# Patient Record
Sex: Male | Born: 1970 | Race: White | Hispanic: No | Marital: Married | Smoking: Never smoker
Health system: Southern US, Community
[De-identification: ages and names within clinical notes are randomized; demographics above are authoritative.]

## PROBLEM LIST (undated history)

## (undated) DIAGNOSIS — M1990 Unspecified osteoarthritis, unspecified site: Secondary | ICD-10-CM

## (undated) HISTORY — DX: Unspecified osteoarthritis, unspecified site: M19.90

---
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
## 2009-02-11 ENCOUNTER — Ambulatory Visit: Payer: Self-pay | Admitting: Family Medicine

## 2012-12-08 DIAGNOSIS — E669 Obesity, unspecified: Secondary | ICD-10-CM | POA: Insufficient documentation

## 2017-11-26 ENCOUNTER — Ambulatory Visit (INDEPENDENT_AMBULATORY_CARE_PROVIDER_SITE_OTHER): Payer: BLUE CROSS/BLUE SHIELD | Admitting: Urology

## 2017-11-26 ENCOUNTER — Encounter: Payer: Self-pay | Admitting: Urology

## 2017-11-26 VITALS — BP 146/90 | HR 82 | Ht 74.0 in | Wt 280.0 lb

## 2017-11-26 DIAGNOSIS — N5203 Combined arterial insufficiency and corporo-venous occlusive erectile dysfunction: Secondary | ICD-10-CM

## 2017-11-26 DIAGNOSIS — N442 Benign cyst of testis: Secondary | ICD-10-CM

## 2017-11-26 DIAGNOSIS — N451 Epididymitis: Secondary | ICD-10-CM

## 2017-11-26 MED ORDER — DOXYCYCLINE HYCLATE 100 MG PO CAPS: 100.0000 mg | ORAL_CAPSULE | Freq: Two times a day (BID) | ORAL | 0 refills | Status: DC

## 2017-11-26 MED ORDER — SILDENAFIL CITRATE 20 MG PO TABS: 20.0000 mg | ORAL_TABLET | ORAL | 11 refills | Status: DC | PRN

## 2017-11-26 NOTE — Progress Notes (Signed)
11/26/2017 3:07 PM   Christopher Watts Apr 08, 1971 [id number]  Referring provider: Dione Housekeeper, MD [address] [place], [place] [address]  Chief Complaint  Patient presents with  . Groin Pain    New Patient    HPI: 47 year old male who presents today to discuss right testicular pain as well as right epididymal cyst.  He reports that about a month ago, he was experiencing right flank pain which extended across his back and radiated to his groin and right testicle.  His back pain has since resolved but he continues to have intermittent moderate to severe right scrotal pain which is exacerbated by physical activity.  It is improving but not completely resolved.  He reports that when walking a long distance, he has worsened pain in the right testicle.  He denies any urinary symptoms including gross hematuria or dysuria.  No history of urinary tract infections or epididymitis.  He does have a remote history of right scrotal surgery performed by Dr. Jenne Pane at the age of 74.  He enlisted in [employer] and was found to have an incidental what sounds like a right epididymal cyst and underwent excision of this.  He does not believe the lesion was intratesticular.  As part of the evaluation for his continued testicular discomfort, he underwent a scrotal ultrasound performed at [facility] facility indicating a 3 mm cyst present peripherally within the right testicle but no other testicular masses.  He also had a right epididymal mass measuring 8 x 8 x 10 mm described as having low-level internal echoes without any obvious intraluminal vascularity.  This was thought to represent or spermatocele or epididymal cyst complicated by hemorrhage or infection.  He has not been treated with any antibiotics.  He also as an aside today mentions that he is been struggling with erectile dysfunction.  He reports that he has difficulty achieving an erection at times with decreased overall tumescence.   He also occasionally loses erection prematurely.  He has not previously tried any medications.  He has multiple risk factors for cardiac disease including obesity, borderline diabetes, borderline hyperlipidemia.    PMH: Past Medical History:  Diagnosis Date  . Arthritis     Surgical History: Past Surgical History:  Procedure Laterality Date  . TESTICLAR CYST EXCISION  1989    Home Medications:  Allergies as of 11/26/2017   Not on File     Medication List        Accurate as of 11/26/17 11:59 PM. Always use your most recent med list.          doxycycline 100 MG capsule Commonly known as:  VIBRAMYCIN Take 1 capsule (100 mg total) by mouth 2 (two) times daily.   sildenafil 20 MG tablet Commonly known as:  REVATIO Take 1 tablet (20 mg total) by mouth as needed. Take 1-5 tabs as needed prior to intercourse       Allergies: Not on File  Family History: History reviewed. No pertinent family history.  Social History:  reports that he has never smoked. He has never used smokeless tobacco. He reports that he drank alcohol. He reports that he has current or past drug history.  ROS: UROLOGY Frequent Urination?: Yes Hard to postpone urination?: No Burning/pain with urination?: No Get up at night to urinate?: Yes Leakage of urine?: No Urine stream starts and stops?: No Trouble starting stream?: No Do you have to strain to urinate?: No Blood in urine?: No Urinary tract infection?: No Sexually  transmitted disease?: No Injury to kidneys or bladder?: No Painful intercourse?: No Weak stream?: No Erection problems?: Yes Penile pain?: No  Gastrointestinal Nausea?: No Vomiting?: No Indigestion/heartburn?: No Diarrhea?: No Constipation?: No  Constitutional Fever: No Night sweats?: Yes Weight loss?: No Fatigue?: Yes  Skin Skin rash/lesions?: No Itching?: No  Eyes Blurred vision?: No Double vision?: No  Ears/Nose/Throat Sore throat?: No Sinus problems?:  No  Hematologic/Lymphatic Swollen glands?: No Easy bruising?: No  Cardiovascular Leg swelling?: No Chest pain?: No  Respiratory Cough?: Yes Shortness of breath?: No  Endocrine Excessive thirst?: No  Musculoskeletal Back pain?: Yes Joint pain?: Yes  Neurological Headaches?: No Dizziness?: No  Psychologic Depression?: No Anxiety?: No  Physical Exam: BP (!) 146/90   Pulse 82   Ht  (1.88 m)   Wt 280 lb (127 kg)   BMI 35.95 kg/m   Constitutional:  Alert and oriented, No acute distress. HEENT: [place] AT, moist mucus membranes.  Trachea midline, no masses. Cardiovascular: No clubbing, cyanosis, or edema. Respiratory: Normal respiratory effort, no increased work of breathing. GI: Abdomen is soft, nontender, nondistended, no abdominal masses GU: Circumcised phallus with orthotopic meatus.  Bilateral descended testicles.  Left testicle normal.  Right testicle with 1 cm epididymal cyst which is exquisitely tender to palpation.  Right testicle without any obvious lesions/tumors. Lymph: No cervical or inguinal lymphadenopathy. Skin: No rashes, bruises or suspicious lesions. Neurologic: Grossly intact, no focal deficits, moving all 4 extremities. Psychiatric: Normal mood and affect.  Laboratory Data: Creatinine 1.104/2019  Urinalysis UA from 10/18/2017 reviewed, negative other than for trace protein and trace ketones, 1+ bilirubin.  No leukocytes or nitrates or blood.  Pertinent Imaging: Scrotal ultrasound with color flow imaging and Doppler  History: Right testicular pain. Testicular mass N50.9   Technique: Real time imaging, color flow imaging, Doppler evaluation were performed.  Findings: Testicles are normal size and uniform echotexture. Normal arterial inflow and venous outflow confirmed within each testicle utilizing color flow imaging and Doppler. A 3 mm cyst is present peripherally in the right testicle; otherwise, no testicular mass is identified.The  left epididymis is unremarkable. The right epididymis contains an 8 x 8 x 10 mm mass with good through transmission but with low-level internal echoes but no obvious intraluminal vascularity.   Impression:  1. Tiny right testicular cyst. 2. One cm maximum diameter mass within the right epididymis may represent a spermatocele or epididymal cyst previously complicated by hemorrhage or infection. It does not show peripheral hyperemia at this time.   Electronically Signed WU:[id number] Konrad Dolores, MD, [facility] Electronically Signed on:10/26/2017 2:37 PM  Other Result Information  Interface, Rad Results In - 10/26/2017  2:38 PM EDT Scrotal ultrasound with color flow imaging and Doppler  History: Right testicular pain. Testicular mass N50.9   Technique: Real time imaging, color flow imaging, Doppler evaluation were performed.  Findings: Testicles are normal size and uniform echotexture. Normal arterial inflow and venous outflow confirmed within each testicle utilizing color flow imaging and Doppler. A 3 mm cyst is present peripherally in the right testicle; otherwise, no testicular mass is identified.  The left epididymis is unremarkable. The right epididymis contains an 8 x 8 x 10 mm mass with good through transmission but with low-level internal echoes but no obvious intraluminal vascularity.   Impression:  1. Tiny right testicular cyst. 2. One cm maximum diameter mass within the right epididymis may represent a spermatocele or epididymal cyst previously complicated by hemorrhage or infection. It does not show peripheral hyperemia  at this time.   Electronically Signed by:  Isidore Moos, MD, [facility] Electronically Signed on:  10/26/2017 2:37 PM   Imaging report reviewed, unable to visualize actual images  Assessment & Plan:    1. Combined arterial insufficiency and corporo-venous occlusive erectile dysfunction We discussed the pathophysiology of erectile dysfunction  today along with possible congestive any factors. Discussed possible treatment options including PDE 5 inhibitors, vacuum erectile device, intracavernosal injection, MUSE, and placement of the inflatable or malleable penal prosthesis for refractory cases.  In terms of PDE 5 inhibitors, we discussed contraindications for this medication as well as common side effects. Patient was counseled on optimal use. All of his questions were answered in detail.  Given his fairly young age for erectile dysfunction as well as risk factors including borderline hyperlipidemia, borderline diabetes and obesity, I am very concerned that he has underlying cardiovascular disease.  I offered to refer him to cardiology which he declined.  He will discuss this further with his primary care physician.   2. Right epididymitis Right epididymal cyst with suspected concomitant epididymitis due to exquisite tenderness We will go ahead and treat with doxycycline, NSAIDs and supportive care If fails to resolve, advised to return sooner  3. Testicular cyst Based on imaging report, small 3 mm intratesticular cyst Given that I am unable to review the images today, will be helpful to have these images and I have advised him to obtain the disc and bring it to our office In addition, I would like to repeat a scrotal ultrasound about 6 months to ensure stability He is agreeable with this plan - US SCROTUM; Future   Return in about 6 months (around 05/29/2018) for testicular ultrasound.  Vanna Scotland, MD  [facility] [address] [place], [place] [address] [phone]

## 2017-11-28 ENCOUNTER — Encounter: Payer: Self-pay | Admitting: Urology

## 2017-11-29 ENCOUNTER — Telehealth: Payer: Self-pay | Admitting: Urology

## 2017-11-29 ENCOUNTER — Other Ambulatory Visit: Payer: Self-pay

## 2017-11-29 MED ORDER — SILDENAFIL CITRATE 20 MG PO TABS: 20.0000 mg | ORAL_TABLET | ORAL | 11 refills | Status: DC | PRN

## 2017-11-29 NOTE — Telephone Encounter (Signed)
Rx sent to [facility], canceled at [facility], pt informed

## 2017-11-29 NOTE — Telephone Encounter (Signed)
Patient called and stated that the script was sent to [facility] for sildenafil and it should have been sent to [facility]. Can we change that please?  Marcelino Duster

## 2017-12-30 ENCOUNTER — Encounter: Payer: Self-pay | Admitting: Urology

## 2018-06-03 ENCOUNTER — Ambulatory Visit: Payer: BLUE CROSS/BLUE SHIELD | Admitting: Urology

## 2018-07-04 ENCOUNTER — Ambulatory Visit: Payer: BLUE CROSS/BLUE SHIELD

## 2018-07-04 ENCOUNTER — Ambulatory Visit
Admission: RE | Admit: 2018-07-04 | Discharge: 2018-07-04 | Disposition: A | Discharge status: Home / Self Care | Payer: BLUE CROSS/BLUE SHIELD | Source: Ambulatory Visit | Attending: Urology | Admitting: Urology

## 2018-07-04 DIAGNOSIS — N442 Benign cyst of testis: Secondary | ICD-10-CM | POA: Insufficient documentation

## 2018-07-04 DIAGNOSIS — I861 Scrotal varices: Secondary | ICD-10-CM | POA: Diagnosis not present

## 2018-07-04 DIAGNOSIS — N433 Hydrocele, unspecified: Secondary | ICD-10-CM | POA: Insufficient documentation

## 2018-08-10 ENCOUNTER — Encounter: Payer: Self-pay | Admitting: Urology

## 2018-08-10 ENCOUNTER — Ambulatory Visit (INDEPENDENT_AMBULATORY_CARE_PROVIDER_SITE_OTHER): Payer: BLUE CROSS/BLUE SHIELD | Admitting: Urology

## 2018-08-10 VITALS — BP 174/99 | HR 83 | Ht 74.0 in | Wt 288.6 lb

## 2018-08-10 DIAGNOSIS — N451 Epididymitis: Secondary | ICD-10-CM | POA: Diagnosis not present

## 2018-08-10 DIAGNOSIS — N442 Benign cyst of testis: Secondary | ICD-10-CM | POA: Diagnosis not present

## 2018-08-10 DIAGNOSIS — N5203 Combined arterial insufficiency and corporo-venous occlusive erectile dysfunction: Secondary | ICD-10-CM | POA: Diagnosis not present

## 2018-08-10 NOTE — Progress Notes (Signed)
08/10/2018 4:18 PM   Christopher Watts 1971/05/05 [id number]  Referring provider: Arne [place], MD [address] [place], [place] [address]  Chief Complaint  Patient presents with  . Follow-up    HPI: 48 year old male who returns today for six-month follow-up with scrotal ultrasound for history of testicular pain/testicular cyst as well as erectile dysfunction.  Scrotal pain/ testicular cyst/ epididymal mass He does have a remote history of right scrotal surgery performed by Dr. Jenne Pane at the age of 64.  He enlisted in [employer] and was found to have an incidental what sounds like a right epididymal cyst and underwent excision of this.  He does not believe the lesion was intratesticular.   As part of the evaluation for his continued testicular discomfort, he underwent a scrotal ultrasound performed at [facility] facility on 10/26/2017 indicating a 3 mm cyst present peripherally within the right testicle but no other testicular masses.  He also had a right epididymal mass measuring 8 x 8 x 10 mm described as having low-level internal echoes without any obvious intraluminal vascularity.  This was thought to represent or spermatocele or epididymal cyst complicated by hemorrhage or infection.  Follow-up scrotal ultrasound performed 08/04/2017 shows similar unchanged pathology.  Overall, he is doing significantly better.  He was treated for presumed epididymitis after last visit and his acute severe scrotal pain resolved.  He does have a chronic mild dull ache in his right scrotum which he has had for numerous years.  This is unchanged.  The seems to be exacerbated with sexual activity.  Alleviated with rest.  No voiding symptoms.  ED Some mild difficulty maintaining and achieving erections.  Last visit, given prescription for sildenafil which she is using a variable amount.  When he takes higher doses, he has headaches and flushing.  A low-dose, it is better tolerated with  fairly decent effect.  Notably, he has had a recent complete evaluation with lipids as well as EKG by his primary care with no concern for heart disease.  He is prediabetic.  PMH: Past Medical History:  Diagnosis Date  . Arthritis     Surgical History: Past Surgical History:  Procedure Laterality Date  . TESTICLAR CYST EXCISION  1989    Home Medications:  Allergies as of 08/10/2018      Reactions   Penicillins Hives, Rash   In childhood; has tolerated Augmentin as an adult      Medication List       Accurate as of August 10, 2018 11:59 PM. Always use your most recent med list.        sildenafil 20 MG tablet Commonly known as:  REVATIO Take 1 tablet (20 mg total) by mouth as needed. Take 1-5 tabs as needed prior to intercourse       Allergies:  Allergies  Allergen Reactions  . Penicillins Hives and Rash    In childhood; has tolerated Augmentin as an adult     Family History: History reviewed. No pertinent family history.  Social History:  reports that he has never smoked. He has never used smokeless tobacco. He reports previous alcohol use. He reports previous drug use.  ROS: UROLOGY Frequent Urination?: No Hard to postpone urination?: No Burning/pain with urination?: No Get up at night to urinate?: Yes Leakage of urine?: No Urine stream starts and stops?: No Trouble starting stream?: No Do you have to strain to urinate?: No Blood in urine?: No Urinary tract infection?: No Sexually transmitted disease?: No  Injury to kidneys or bladder?: No Painful intercourse?: No Weak stream?: No Erection problems?: No Penile pain?: No  Gastrointestinal Nausea?: No Vomiting?: No Indigestion/heartburn?: No Diarrhea?: No Constipation?: No  Constitutional Fever: No Night sweats?: No Weight loss?: No Fatigue?: No  Skin Skin rash/lesions?: No Itching?: No  Eyes Blurred vision?: No Double vision?: No  Ears/Nose/Throat Sore throat?: No Sinus  problems?: No  Hematologic/Lymphatic Swollen glands?: No Easy bruising?: No  Cardiovascular Leg swelling?: No Chest pain?: No  Respiratory Cough?: No Shortness of breath?: No  Endocrine Excessive thirst?: No  Musculoskeletal Back pain?: No Joint pain?: No  Neurological Headaches?: No Dizziness?: No  Psychologic Depression?: No Anxiety?: No  Physical Exam: BP (!) 174/99 (BP Location: Left Arm, Patient Position: Sitting, Cuff Size: Large)   Pulse 83   Ht 6\' 2"  (1.88 m)   Wt 288 lb 9.6 oz (130.9 kg)   BMI 37.05 kg/m   Constitutional:  Alert and oriented, No acute distress. HEENT: [place] AT, moist mucus membranes.  Trachea midline, no masses. Cardiovascular: No clubbing, cyanosis, or edema. Respiratory: Normal respiratory effort, no increased work of breathing. GI: Abdomen is soft, nontender, nondistended, obese GU: In size phallus with bilateral descended testicles.  Left testicle is normal, no masses.  Right epididymis is enlarged, 1 cm but nontender, presumably secondary to spermatocele.  Right testicle is grossly normal, nontender no masses palpable. Skin: No rashes, bruises or suspicious lesions. Neurologic: Grossly intact, no focal deficits, moving all 4 extremities. Psychiatric: Normal mood and affect.  Pertinent Imaging: CLINICAL DATA:  Right testicular cyst, intermittent pain x4 months.  EXAM: ULTRASOUND OF SCROTUM  TECHNIQUE: Complete ultrasound examination of the testicles, epididymis, and other scrotal structures was performed.  COMPARISON:  None.  FINDINGS: Right testicle  Measurements: 4.8 x 2.3 x 3.2 cm. A well-circumscribed hypoechoic thinly septated appearing cyst possibly a tunic albuginea or tiny intratesticular cyst is noted measuring approximately 3 mm in diameter along the lateral interpolar aspect. No demonstrable vascularity is seen within.  Left testicle  Measurements: 4.8 x 2.7 x 2.8 cm. No mass or  microlithiasis visualized.  Right epididymis: Normal in size measuring approximately 1.6 x 1.2 x 2.5 cm. A hypoechoic 1.2 x 0.8 x 0.8 cm mass without vascularity is also noted.  Left epididymis:  Normal in size measuring 1.2 x 1.1 x 1.2 cm.  Hydrocele:  Small left hydrocele  Varicocele:  Small left-sided varicocele  IMPRESSION: Tiny 3 mm, too small to further characterize complex cyst associated with the right testicle is identified potentially a tunica albuginea cyst or tiny intratesticular cyst.  Well-circumscribed hypoechoic mass of the right epididymis measuring 1.2 x 0.8 x 0.8 cm without internal vascularity. Extratesticular lesions without demonstrable vascularity more likely to represent a benign finding possibly a complex epididymal cyst or adenomatoid lesion of the epididymis.  Small left-sided hydrocele and varicoceles.   Electronically Signed   By: Tollie Ethavid  Kwon M.D.   On: 07/04/2018 17:14 . Scrotal ultrasound imaging was personally reviewed.  Agree with radiologic interpretation.  Assessment & Plan:    1. Right testicular/scrotal pain, right epididymal cyst/spermatocele We discussed today that given the chronicity of his pain, this may be related to previous scrotal surgery Recommend supportive care, NSAIDs as needed, and referral to PT if deemed necessary Pain is dull and mild this he is able to tolerate it without difficulty We discussed the role of excision of right epididymal cyst, do not suspect that removing this this will improve his pain, patient is not interested  in surgery - US PELVIC DOPPLER (TORSION R/O OR MASS ARTERIAL FLOW); Future - US SCROTUM; Future  2. Testicular cyst Stable 3 mm right intratesticular cyst Given no change since 10/2017, malignancy is very low on differential Plan to repeat scrotal stone 18 months to ensure stability Patient is agreeable this plan He will perform testicular self exams as a precaution - US PELVIC  DOPPLER (TORSION R/O OR MASS ARTERIAL FLOW); Future - US SCROTUM; Future  3. Combined arterial insufficiency and corporo-venous occlusive erectile dysfunction Continue Viagra as needed  Return in about 18 months (around 02/08/2020) for scrotal ultrasound.  Vanna ScotlandAshley Laquesha Holcomb, MD  [facility] [address], [address] [place], [place] [id number] [phone]

## 2018-08-26 ENCOUNTER — Ambulatory Visit: Payer: BLUE CROSS/BLUE SHIELD | Admitting: Urology

## 2019-04-14 ENCOUNTER — Ambulatory Visit: Payer: Self-pay

## 2019-04-14 DIAGNOSIS — Z23 Encounter for immunization: Secondary | ICD-10-CM

## 2019-07-04 ENCOUNTER — Other Ambulatory Visit: Payer: Self-pay

## 2020-01-25 ENCOUNTER — Other Ambulatory Visit: Payer: Self-pay | Admitting: Urology

## 2020-01-25 DIAGNOSIS — N451 Epididymitis: Secondary | ICD-10-CM

## 2020-01-25 DIAGNOSIS — N442 Benign cyst of testis: Secondary | ICD-10-CM

## 2020-02-01 ENCOUNTER — Other Ambulatory Visit: Payer: Self-pay

## 2020-02-01 ENCOUNTER — Ambulatory Visit
Admission: RE | Admit: 2020-02-01 | Discharge: 2020-02-01 | Disposition: A | Discharge status: Home / Self Care | Payer: 59 | Source: Ambulatory Visit | Attending: Urology | Admitting: Urology

## 2020-02-01 ENCOUNTER — Ambulatory Visit: Payer: Managed Care, Other (non HMO)

## 2020-02-01 DIAGNOSIS — N451 Epididymitis: Secondary | ICD-10-CM | POA: Insufficient documentation

## 2020-02-01 DIAGNOSIS — N442 Benign cyst of testis: Secondary | ICD-10-CM | POA: Diagnosis present

## 2020-02-05 NOTE — Progress Notes (Signed)
02/06/2020 9:19 AM   Christopher Watts Dec 13, 1970 [id number]  Referring provider: [facility] [address] [place],  [place] [address] Chief Complaint  Patient presents with  . Follow-up    HPI: Christopher Watts is a 49 y.o. male who returns today for six-month follow-up with scrotal ultrasound for history of testicular pain/testicular cyst as well as erectile dysfunction.  Scrotal pain/ testicular cyst/ epididymal mass He does have a remote history of right scrotal surgery performed by Dr. Jenne Pane at the age of 52. He enlisted in [employer] and was found to have an incidental what sounds like a right epididymal cyst and underwent excision of this. He does not believe the lesion was intratesticular.   As part of the evaluation for his continued testicular discomfort, he underwent a scrotal ultrasound performed at [facility] facility on 10/26/2017 indicating a 3 mm cyst present peripherally within the right testicle but no other testicular masses. He also had a right epididymal mass measuring 8 x 8 x 10 mm described as having low-level internal echoes without any obvious intraluminal vascularity. This was thought to represent or spermatocele or epididymal cyst complicated by hemorrhage or infection.  Follow up scrotal US with doppler on 02/01/2020 showed no acute abnormality.  No evidence for testicular torsion. Relatively stable hypoechoic 1.3 cm mass involving the right epididymis. Again, this may represent a complex epididymal head cyst or adenomatoid lesion. Given its relative stability since prior study, it was almost certainly a benign finding.  He reports having lower abdominal pain with ejaculation. He recently had intercourse and felt dizzy and could not ejaculate. He had a sharp migraine headache, his knees went weak and he felt like he was going to pass out. He felt this episode was related to a low blood sugar and/or dehydration.  This only happened once and  never again.  ED Previously was using PDE 5 inhibitors.  No longer requires this after lifestyle change in behavior change.  Normal spontaneous erections.  Notably, he has had a recent complete evaluation with lipids as well as EKG by his primary care with no concern for heart disease.  He is prediabetic.  PMH: Past Medical History:  Diagnosis Date  . Arthritis     Surgical History: Past Surgical History:  Procedure Laterality Date  . TESTICLAR CYST EXCISION  1989    Home Medications:  Allergies as of 02/06/2020      Reactions   Penicillins Hives, Rash   In childhood; has tolerated Augmentin as an adult      Medication List       Accurate as of February 06, 2020  9:19 AM. If you have any questions, ask your nurse or doctor.        STOP taking these medications   sildenafil 20 MG tablet Commonly known as: Revatio Stopped by: Vanna Scotland, MD       Allergies:  Allergies  Allergen Reactions  . Penicillins Hives and Rash    In childhood; has tolerated Augmentin as an adult     Family History: No family history on file.  Social History:  reports that he has never smoked. He has never used smokeless tobacco. He reports previous alcohol use. He reports previous drug use.   Physical Exam: BP (!) 176/101   Pulse 98   Ht 6\' 2"  (1.88 m)   Wt (!) 285 lb (129.3 kg)   BMI 36.59 kg/m   Constitutional:  Alert and oriented, No acute  distress. HEENT: [place] AT, moist mucus membranes.  Trachea midline, no masses. Cardiovascular: No clubbing, cyanosis, or edema. Respiratory: Normal respiratory effort, no increased work of breathing. GI: Abdomen is soft, nontender, nondistended, no abdominal masses GU: Normal circumcised phallus.  Bilateral testicles descended, no masses or lesions. Skin: No rashes, bruises or suspicious lesions. Neurologic: Grossly intact, no focal deficits, moving all 4 extremities. Psychiatric: Normal mood and affect.   Pertinent Imaging: CLINICAL  DATA:  Test saccular cyst  EXAM: SCROTAL ULTRASOUND  DOPPLER ULTRASOUND OF THE TESTICLES  TECHNIQUE: Complete ultrasound examination of the testicles, epididymis, and other scrotal structures was performed. Color and spectral Doppler ultrasound were also utilized to evaluate blood flow to the testicles.  COMPARISON:  July 04, 2018.  FINDINGS: Right testicle  Measurements: 4.6 x 2.7 x 3.6 cm. No mass or microlithiasis visualized.  Left testicle  Measurements: 4.4 x 2.6 x 2.9 cm. No mass or microlithiasis visualized.  Right epididymis: Again noted is a complex 1.3 x 1.2 x 1.3 cm mass involving the right epididymis (previously measuring 1.2 x 0.8 x 0.8 cm this mass demonstrates no significant internal color Doppler flow.  Left epididymis:  Normal in size and appearance.  Hydrocele:  None visualized.  Varicocele:  There is a left-sided varicocele.  Pulsed Doppler interrogation of both testes demonstrates normal low resistance arterial and venous waveforms bilaterally.  IMPRESSION: 1. No acute abnormality.  No evidence for testicular torsion. 2. Relatively stable hypoechoic 1.3 cm mass involving the right epididymis. Again, this may represent a complex epididymal head cyst or adenomatoid lesion. Given its relative stability since prior study, it is almost certainly a benign finding.   Electronically Signed   By: Katherine Mantle M.D.   On: 02/01/2020 21:37   I have personally reviewed the images and agree with radiologist interpretation.    Assessment & Plan:    1. Right epididymal cyst/spermatocele Stable and unchanged.  Benign.  No follow up surveillance needed.  2. Testicular cyst Not seen on most recent ultrasound, small and minimally worrisome 2019 At this point, I see no further indication for continued surveillance Patient is agreeable this plan  3. Combined arterial insufficiency and corporo-venous occlusive erectile  dysfunction  No longer requiring Viagra, resume as needed Congratulated for lifestyle changes  F/u prn  [facility] [address] [place], [place] [address] [phone]  I, Theador Hawthorne, am acting as a scribe for Dr. Vanna Scotland.  I have reviewed the above documentation for accuracy and completeness, and I agree with the above.   Vanna Scotland, MD

## 2020-02-06 ENCOUNTER — Ambulatory Visit (INDEPENDENT_AMBULATORY_CARE_PROVIDER_SITE_OTHER): Payer: 59 | Admitting: Urology

## 2020-02-06 ENCOUNTER — Encounter: Payer: Self-pay | Admitting: Urology

## 2020-02-06 ENCOUNTER — Other Ambulatory Visit: Payer: Self-pay

## 2020-02-06 VITALS — BP 176/101 | HR 98 | Ht 74.0 in | Wt 285.0 lb

## 2020-02-06 DIAGNOSIS — N451 Epididymitis: Secondary | ICD-10-CM

## 2020-02-06 DIAGNOSIS — N5203 Combined arterial insufficiency and corporo-venous occlusive erectile dysfunction: Secondary | ICD-10-CM | POA: Diagnosis not present

## 2020-02-06 DIAGNOSIS — N442 Benign cyst of testis: Secondary | ICD-10-CM | POA: Diagnosis not present

## 2020-02-07 ENCOUNTER — Other Ambulatory Visit: Payer: Self-pay

## 2020-02-07 ENCOUNTER — Encounter: Payer: Self-pay | Admitting: Emergency Medicine

## 2020-02-07 ENCOUNTER — Ambulatory Visit: Payer: 59 | Admitting: Emergency Medicine

## 2020-02-07 VITALS — BP 157/91 | HR 89 | Temp 97.6°F | Resp 14 | Ht 74.0 in | Wt 291.0 lb

## 2020-02-07 DIAGNOSIS — M25531 Pain in right wrist: Secondary | ICD-10-CM

## 2020-02-07 MED ORDER — MELOXICAM 15 MG PO TABS
15.0000 mg | ORAL_TABLET | Freq: Every day | ORAL | 1 refills | Status: DC
Start: 2020-02-07 — End: 2021-03-12

## 2020-02-07 NOTE — Progress Notes (Signed)
   I have reviewed the triage vital signs and the nursing notes.   HISTORY  Chief Complaint Hand Pain (Right)  HPI Christopher Watts is a 49 y.o. male is here with complaint of right wrist pain off and on for 1 year.  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  He has occasionally taken Tylenol or over-the-counter anti-inflammatories only when it flares up.  He denies any previous symptoms of carpal tunnel.  He states that occasionally he does have some weakness to his right hand when he is picking up something.  Today states his pain is a 2 out of 10.      Past Medical History:  Diagnosis Date  . Arthritis     Patient Active Problem List   Diagnosis Date Noted  . Obesity, unspecified 12/08/2012    Past Surgical History:  Procedure Laterality Date  . TESTICLAR CYST EXCISION  1989    Prior to Admission medications   Not on File    Allergies Penicillins  Family History  Adopted: Yes    Social History Social History   Tobacco Use  . Smoking status: Never Smoker  . Smokeless tobacco: Never Used  Substance Use Topics  . Alcohol use: Not Currently  . Drug use: Not Currently    Review of Systems Constitutional: No fever/chills Musculoskeletal: Positive for right wrist pain. Skin: Negative for rash. Neurological: Negative for  focal weakness or numbness.  ____________________________________________   PHYSICAL EXAM: Constitutional: Alert and oriented. Well appearing and in no acute distress. Eyes: Conjunctivae are normal.  Head: Atraumatic. Nose: No congestion/rhinnorhea. Neck: No stridor.  Musculoskeletal: Right wrist with no deformity.  Patient is able to flex and extend without restriction.  Skin is intact.  No soft tissue edema noted.  Patient is able move all digits without any difficulty and motor sensory function is intact.  Capillary refills less than 3 seconds.  There is a  questionably positive Tinnel's sign. Neurologic:  Normal speech and language. No gross focal neurologic deficits are appreciated.  Skin:  Skin is warm, dry and intact. No rash noted. Psychiatric: Mood and affect are normal. Speech and behavior are normal.   ____________________________________________   FINAL CLINICAL IMPRESSION(S) / ED DIAGNOSES  Chronic right wrist pain, possible early carpal tunnel syndrome.  Patient was placed in a cock-up splint and a prescription for meloxicam was sent to his pharmacy.  He is to take 1 daily with food.  If not improving he is aware that possibly the next step would be either steroids or to send him for a nerve conduction study.  He will let us know how he is doing.    ED Discharge Orders    None       Note:  This document was prepared using Dragon voice recognition software and may include unintentional dictation errors.

## 2020-02-07 NOTE — Progress Notes (Signed)
States BP was elevated yesterday at [facility].  Right Wrist pain started about a year ago. Has been taking advil & tylenol.   Intermittent pain  Flared by up today.  Rates a 2 on scale of 0 - 10.  PCP is [facility] in [place]

## 2020-04-11 ENCOUNTER — Other Ambulatory Visit: Payer: Self-pay

## 2020-04-11 ENCOUNTER — Encounter: Payer: Self-pay | Admitting: Physician Assistant

## 2020-04-11 ENCOUNTER — Ambulatory Visit: Payer: Self-pay | Admitting: Physician Assistant

## 2020-04-11 VITALS — BP 151/93 | HR 85 | Temp 99.3°F | Resp 16 | Ht 74.0 in | Wt 289.0 lb

## 2020-04-11 DIAGNOSIS — M25531 Pain in right wrist: Secondary | ICD-10-CM

## 2020-04-11 NOTE — Addendum Note (Signed)
Addended by: Gardner Candle on: 04/11/2020 09:29 AM   Modules accepted: Orders

## 2020-04-11 NOTE — Progress Notes (Signed)
Pt presents today to follow up on right hand pain, possible carpal tunnel per Bridget Hartshorn, Cordelia Poche  Gretel Acre

## 2020-04-11 NOTE — Progress Notes (Signed)
   Subjective: Right hand pain    Patient ID: Christopher Watts, male    DOB: [birth year], 49 y.o.   MRN: [id number]  HPI Patient presents with 3 to 4 months of intermitting right hand pain. Patient last seen in July 2021 placed in a wrist splint and given anti-inflammatory medications. Patient states the splint is bulky for the type of work he needs to perform so he does not wear it at work. 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 Does not sleep in a splint. Is no longer taking meloxicam and says he will have 1 month supply. Patient is right-hand dominant. Patient only mild discomfort with the left nondominant hand.   Review of Systems Negative septal complaint.    Objective:   Physical Exam No obvious deformity to the right wrist/hand. No edema or erythema. Patient has full neck range of motion. Patient has a mild Tinel signs. Negative Phalen's test.       Assessment & Plan: Right wrist pain.  Patient complaint physical exam consistent with repetitive pain injury. Patient will be consulted to OT/PT for further evaluation. OT/PT discussed this is a nerve problem open consult to neurology.

## 2020-06-26 ENCOUNTER — Other Ambulatory Visit: Payer: Self-pay

## 2020-06-26 DIAGNOSIS — Z1152 Encounter for screening for COVID-19: Secondary | ICD-10-CM

## 2020-06-28 LAB — NOVEL CORONAVIRUS, NAA: SARS-CoV-2, NAA: NOT DETECTED

## 2020-06-28 LAB — SPECIMEN STATUS REPORT

## 2020-06-28 LAB — SARS-COV-2, NAA 2 DAY TAT

## 2020-08-02 NOTE — Progress Notes (Signed)
Pt presents today to receive a rapid covid test. Resulted:

## 2020-08-05 ENCOUNTER — Other Ambulatory Visit: Payer: Self-pay

## 2020-08-05 NOTE — Progress Notes (Signed)
[facility] Rapid covid test administered.  Asymptomatic  Exposure - Daughter who lives in home tested positive 07/26/20  Non-vaccinated  Rapid Covid Test Results - Negative  AMD

## 2020-08-22 IMAGING — US US SCROTUM
1 series · 13 of 25 positions shown · non-contrast
Comparison: None.

CLINICAL DATA: Right testicular cyst, intermittent pain x4 months.

EXAM:
ULTRASOUND OF SCROTUM
TECHNIQUE: Complete ultrasound examination of the testicles, epididymis, and
other scrotal structures was performed.

[Series 1: us scrotum · 0.07mm/px · 13 of 58 slices shown]
[im 1/58]
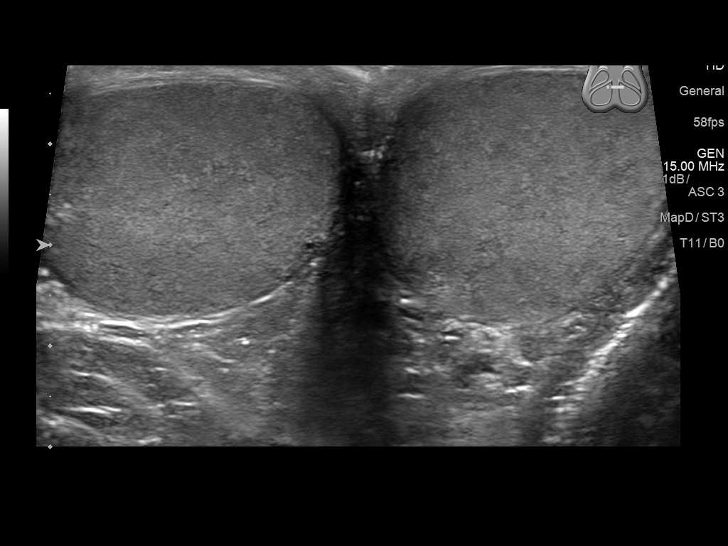
[im 5/58]
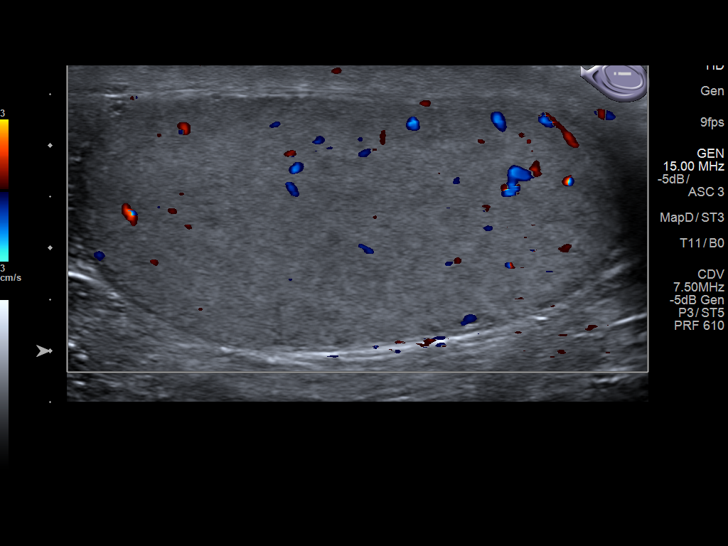
[im 10/58]
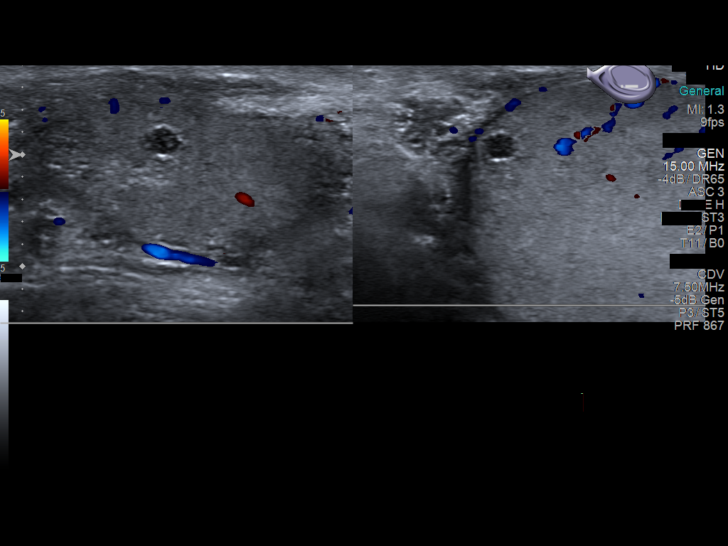
[im 15/58]
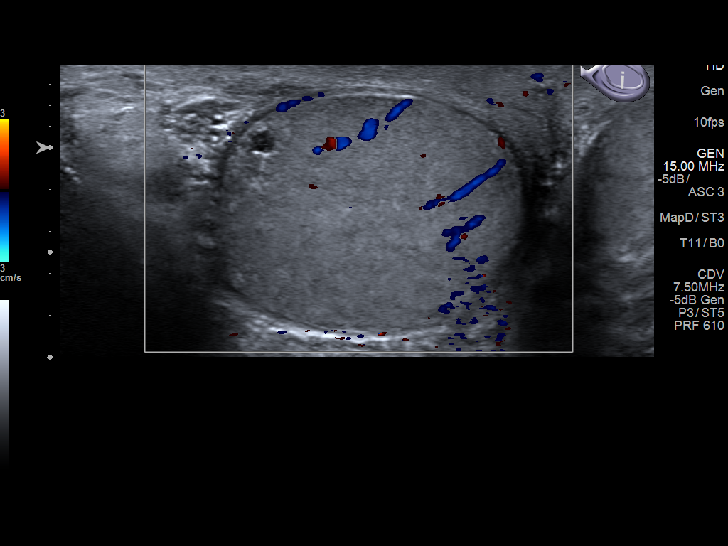
[im 20/58]
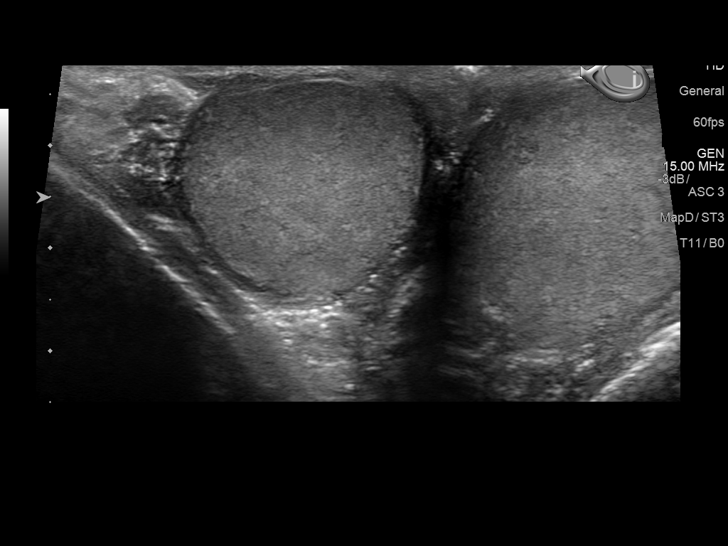
[im 24/58]
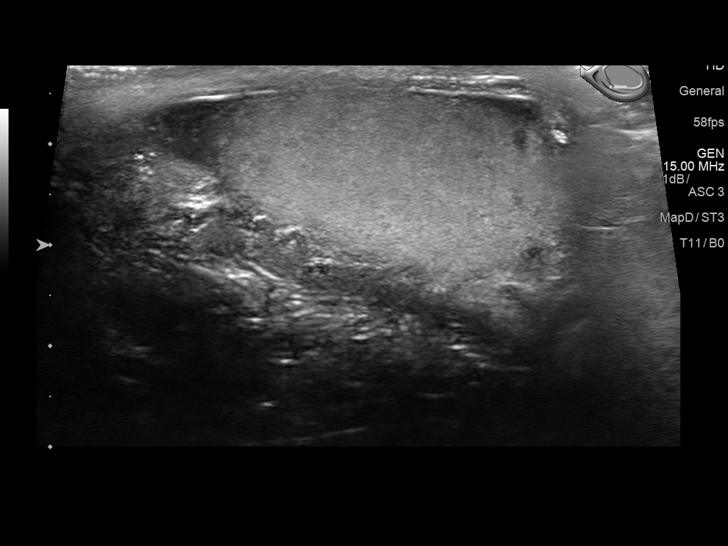
[im 29/58]
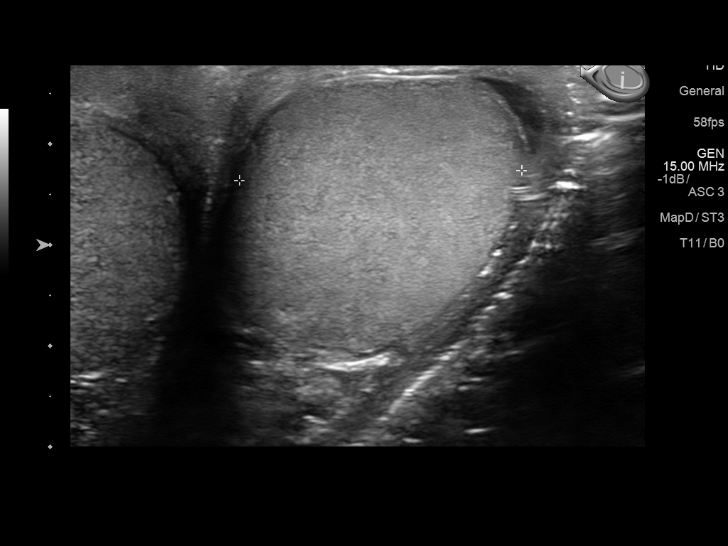
[im 34/58]
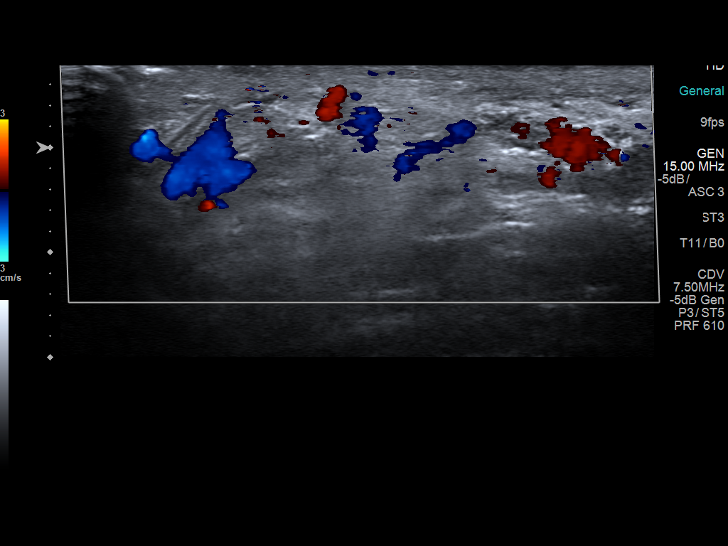
[im 39/58]
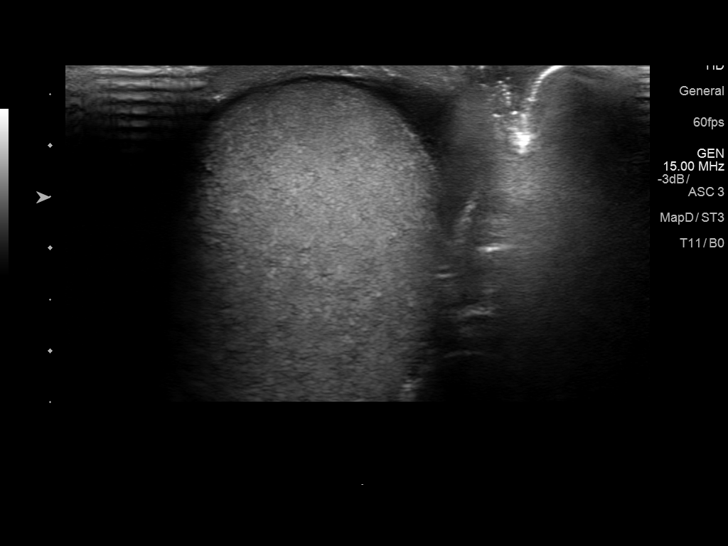
[im 43/58]
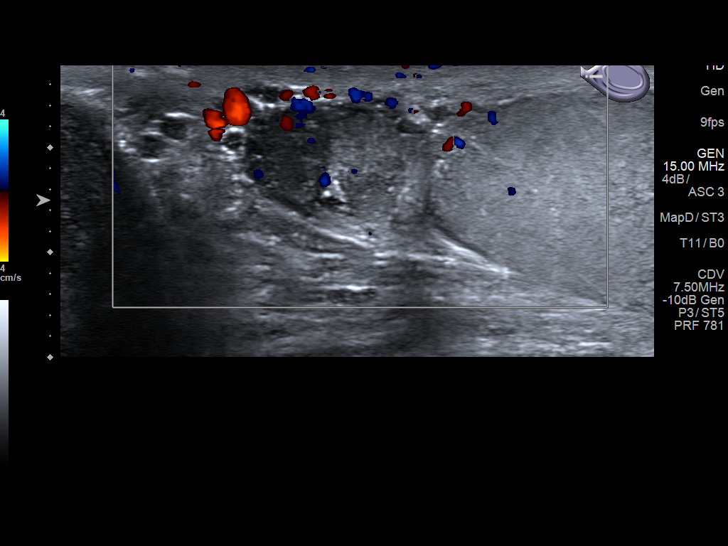
[im 48/58]
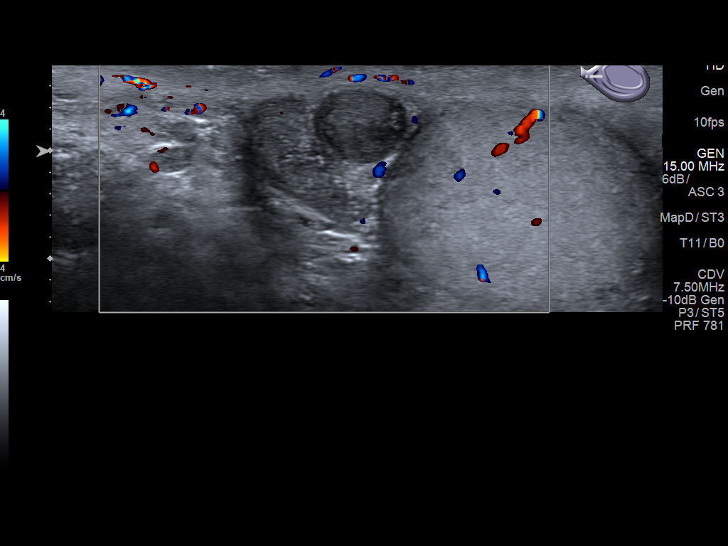
[im 53/58]
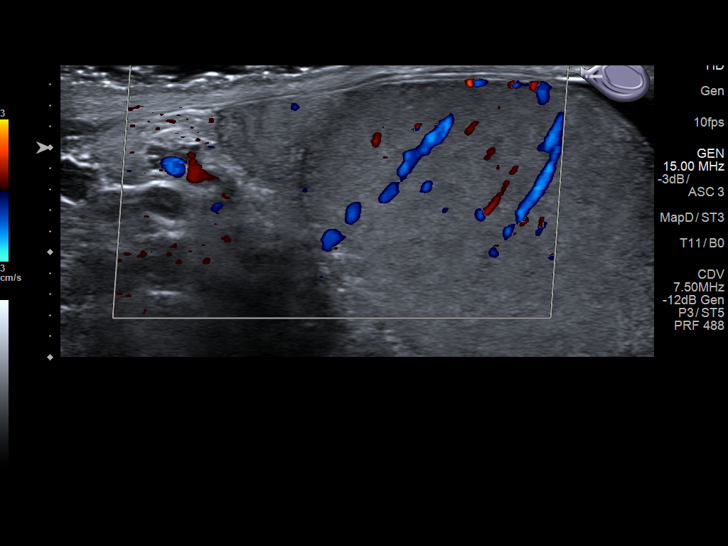
[im 58/58]
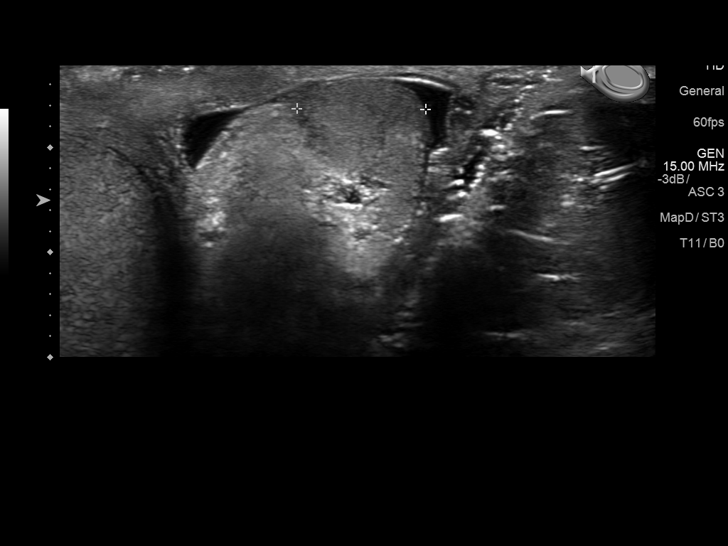

[13 of 25 positions shown; findings below may reference images not displayed]

FINDINGS: Right testicle

Measurements: 4.8 x 2.3 x 3.2 cm. A well-circumscribed hypoechoic
thinly septated appearing cyst possibly a tunic albuginea or tiny
intratesticular cyst is noted measuring approximately 3 mm in
diameter along the lateral interpolar aspect. No demonstrable
vascularity is seen within.

Left testicle

Measurements: 4.8 x 2.7 x 2.8 cm. No mass or microlithiasis
visualized.

Right epididymis: Normal in size measuring approximately 1.6 x 1.2 x
2.5 cm. A hypoechoic 1.2 x 0.8 x 0.8 cm mass without vascularity is
also noted.

Left epididymis:  Normal in size measuring 1.2 x 1.1 x 1.2 cm.

Hydrocele:  Small left hydrocele

Varicocele:  Small left-sided varicocele
IMPRESSION: Tiny 3 mm, too small to further characterize complex cyst associated
with the right testicle is identified potentially a tunica albuginea
cyst or tiny intratesticular cyst.

Well-circumscribed hypoechoic mass of the right epididymis measuring
1.2 x 0.8 x 0.8 cm without internal vascularity. Extratesticular
lesions without demonstrable vascularity more likely to represent a
benign finding possibly a complex epididymal cyst or adenomatoid
lesion of the epididymis.

Small left-sided hydrocele and varicoceles.

## 2020-11-01 ENCOUNTER — Ambulatory Visit: Payer: Self-pay

## 2020-11-01 ENCOUNTER — Other Ambulatory Visit: Payer: Self-pay

## 2020-11-01 DIAGNOSIS — E119 Type 2 diabetes mellitus without complications: Secondary | ICD-10-CM

## 2020-11-01 DIAGNOSIS — Z0000 Encounter for general adult medical examination without abnormal findings: Secondary | ICD-10-CM

## 2020-11-01 NOTE — Progress Notes (Signed)
Labs for physical with PCP at [facility] in [place], [place].  Needs to schedule the appt.  States he will get the lab results from [facility].  Requested an A1c to be added to the [facility] panel.  States he was Dx'd with Type 2 DM this past December 2021.  AMD

## 2020-11-02 LAB — CMP12+LP+TP+TSH+6AC+PSA+CBC…
ALT: 26 IU/L (ref 0–44)
AST: 14 IU/L (ref 0–40)
Albumin/Globulin Ratio: 1.8 (ref 1.2–2.2)
Albumin: 4.3 g/dL (ref 4.0–5.0)
Alkaline Phosphatase: 87 IU/L (ref 44–121)
BUN/Creatinine Ratio: 12 (ref 9–20)
BUN: 12 mg/dL (ref 6–24)
Basophils Absolute: 0.1 10*3/uL (ref 0.0–0.2)
Basos: 1 %
Bilirubin Total: 0.5 mg/dL (ref 0.0–1.2)
Calcium: 8.9 mg/dL (ref 8.7–10.2)
Chloride: 104 mmol/L (ref 96–106)
Chol/HDL Ratio: 5.5 ratio — ABNORMAL HIGH (ref 0.0–5.0)
Cholesterol, Total: 166 mg/dL (ref 100–199)
Creatinine, Ser: 0.98 mg/dL (ref 0.76–1.27)
EOS (ABSOLUTE): 0.1 10*3/uL (ref 0.0–0.4)
Eos: 2 %
Estimated CHD Risk: 1.2 times avg. — ABNORMAL HIGH (ref 0.0–1.0)
Free Thyroxine Index: 2 (ref 1.2–4.9)
GGT: 16 IU/L (ref 0–65)
Globulin, Total: 2.4 g/dL (ref 1.5–4.5)
Glucose: 88 mg/dL (ref 65–99)
HDL: 30 mg/dL — ABNORMAL LOW (ref >39)
Hematocrit: 41.8 % (ref 37.5–51.0)
Hemoglobin: 14.2 g/dL (ref 13.0–17.7)
Immature Grans (Abs): 0 10*3/uL (ref 0.0–0.1)
Immature Granulocytes: 0 %
Iron: 74 ug/dL (ref 38–169)
LDH: 130 IU/L (ref 121–224)
LDL Chol Calc (NIH): 124 mg/dL — ABNORMAL HIGH (ref 0–99)
Lymphocytes Absolute: 1.4 10*3/uL (ref 0.7–3.1)
Lymphs: 19 %
MCH: 28.6 pg (ref 26.6–33.0)
MCHC: 34 g/dL (ref 31.5–35.7)
MCV: 84 fL (ref 79–97)
Monocytes Absolute: 0.7 10*3/uL (ref 0.1–0.9)
Monocytes: 9 %
Neutrophils Absolute: 5 10*3/uL (ref 1.4–7.0)
Neutrophils: 69 %
Phosphorus: 2.9 mg/dL (ref 2.8–4.1)
Platelets: 231 10*3/uL (ref 150–450)
Potassium: 4.4 mmol/L (ref 3.5–5.2)
Prostate Specific Ag, Serum: 2.6 ng/mL (ref 0.0–4.0)
RBC: 4.97 x10E6/uL (ref 4.14–5.80)
RDW: 12.8 % (ref 11.6–15.4)
Sodium: 145 mmol/L — ABNORMAL HIGH (ref 134–144)
T3 Uptake Ratio: 27 % (ref 24–39)
T4, Total: 7.5 ug/dL (ref 4.5–12.0)
TSH: 1.65 u[IU]/mL (ref 0.450–4.500)
Total Protein: 6.7 g/dL (ref 6.0–8.5)
Triglycerides: 62 mg/dL (ref 0–149)
Uric Acid: 5.2 mg/dL (ref 3.8–8.4)
VLDL Cholesterol Cal: 12 mg/dL (ref 5–40)
WBC: 7.3 10*3/uL (ref 3.4–10.8)
eGFR: 95 mL/min/{1.73_m2} (ref >59)

## 2020-11-02 LAB — HGB A1C W/O EAG

## 2020-11-04 LAB — HGB A1C W/O EAG: Hgb A1c MFr Bld: 5.2 % (ref 4.8–5.6)

## 2020-11-04 LAB — SPECIMEN STATUS REPORT

## 2021-01-03 ENCOUNTER — Encounter: Payer: Self-pay | Admitting: Urology

## 2021-01-03 NOTE — Telephone Encounter (Addendum)
Called patient to discuss concerns-most recent physical done on 11/01/20 with he stated labs all WNL. Concerned with family history of Prostate cancer on  Mother's side of family, father's history unknown. Denies any urinary symptoms or abnormalities. Has been changing lifestyle due to diabetes. Reviewed in detail concerns.

## 2021-03-12 ENCOUNTER — Other Ambulatory Visit: Payer: Self-pay

## 2021-03-12 ENCOUNTER — Ambulatory Visit: Payer: Self-pay

## 2021-03-12 VITALS — BP 124/85 | HR 61

## 2021-03-12 DIAGNOSIS — Z0130 Encounter for examination of blood pressure without abnormal findings: Secondary | ICD-10-CM

## 2021-06-13 ENCOUNTER — Other Ambulatory Visit: Payer: Self-pay

## 2021-06-13 ENCOUNTER — Ambulatory Visit: Payer: Self-pay

## 2021-06-13 DIAGNOSIS — Z23 Encounter for immunization: Secondary | ICD-10-CM

## 2021-06-13 DIAGNOSIS — Z0000 Encounter for general adult medical examination without abnormal findings: Secondary | ICD-10-CM

## 2021-06-13 LAB — POCT URINALYSIS DIPSTICK
Bilirubin, UA: NEGATIVE
Blood, UA: NEGATIVE
Glucose, UA: NEGATIVE
Ketones, UA: NEGATIVE
Leukocytes, UA: NEGATIVE
Nitrite, UA: NEGATIVE
Protein, UA: NEGATIVE
Spec Grav, UA: 1.02 (ref 1.010–1.025)
Urobilinogen, UA: 0.2 E.U./dL
pH, UA: 6 (ref 5.0–8.0)

## 2021-06-13 NOTE — Progress Notes (Signed)
06/16/21 annual physical scheduled.   Reviewed CDC recommendations for importance of HIV/ Hep C screening once in lifetime. Patient has declined HIV / Hep C screenings today and will let us know if they should change their mind in the future.

## 2021-06-14 LAB — CMP12+LP+TP+TSH+6AC+PSA+CBC…
ALT: 17 IU/L (ref 0–44)
AST: 14 IU/L (ref 0–40)
Albumin/Globulin Ratio: 2.1 (ref 1.2–2.2)
Albumin: 4.9 g/dL (ref 4.0–5.0)
Alkaline Phosphatase: 72 IU/L (ref 44–121)
BUN/Creatinine Ratio: 12 (ref 9–20)
BUN: 12 mg/dL (ref 6–24)
Basophils Absolute: 0.1 10*3/uL (ref 0.0–0.2)
Basos: 1 %
Bilirubin Total: 0.5 mg/dL (ref 0.0–1.2)
Calcium: 9.2 mg/dL (ref 8.7–10.2)
Chloride: 104 mmol/L (ref 96–106)
Chol/HDL Ratio: 4.5 ratio (ref 0.0–5.0)
Cholesterol, Total: 166 mg/dL (ref 100–199)
Creatinine, Ser: 1.02 mg/dL (ref 0.76–1.27)
EOS (ABSOLUTE): 0.1 10*3/uL (ref 0.0–0.4)
Eos: 2 %
Estimated CHD Risk: 0.9 times avg. (ref 0.0–1.0)
Free Thyroxine Index: 1.7 (ref 1.2–4.9)
GGT: 10 IU/L (ref 0–65)
Globulin, Total: 2.3 g/dL (ref 1.5–4.5)
Glucose: 91 mg/dL (ref 70–99)
HDL: 37 mg/dL — ABNORMAL LOW (ref >39)
Hematocrit: 45.5 % (ref 37.5–51.0)
Hemoglobin: 15.2 g/dL (ref 13.0–17.7)
Immature Grans (Abs): 0 10*3/uL (ref 0.0–0.1)
Immature Granulocytes: 0 %
Iron: 108 ug/dL (ref 38–169)
LDH: 146 IU/L (ref 121–224)
LDL Chol Calc (NIH): 116 mg/dL — ABNORMAL HIGH (ref 0–99)
Lymphocytes Absolute: 1.7 10*3/uL (ref 0.7–3.1)
Lymphs: 27 %
MCH: 28.3 pg (ref 26.6–33.0)
MCHC: 33.4 g/dL (ref 31.5–35.7)
MCV: 85 fL (ref 79–97)
Monocytes Absolute: 0.5 10*3/uL (ref 0.1–0.9)
Monocytes: 8 %
Neutrophils Absolute: 3.7 10*3/uL (ref 1.4–7.0)
Neutrophils: 62 %
Phosphorus: 3 mg/dL (ref 2.8–4.1)
Platelets: 218 10*3/uL (ref 150–450)
Potassium: 4.3 mmol/L (ref 3.5–5.2)
Prostate Specific Ag, Serum: 2.3 ng/mL (ref 0.0–4.0)
RBC: 5.37 x10E6/uL (ref 4.14–5.80)
RDW: 12.2 % (ref 11.6–15.4)
Sodium: 142 mmol/L (ref 134–144)
T3 Uptake Ratio: 26 % (ref 24–39)
T4, Total: 6.5 ug/dL (ref 4.5–12.0)
TSH: 1.61 u[IU]/mL (ref 0.450–4.500)
Total Protein: 7.2 g/dL (ref 6.0–8.5)
Triglycerides: 65 mg/dL (ref 0–149)
Uric Acid: 5.4 mg/dL (ref 3.8–8.4)
VLDL Cholesterol Cal: 13 mg/dL (ref 5–40)
WBC: 6 10*3/uL (ref 3.4–10.8)
eGFR: 90 mL/min/{1.73_m2} (ref >59)

## 2021-06-14 LAB — MICROALBUMIN / CREATININE URINE RATIO
Creatinine, Urine: 138.2 mg/dL
Microalb/Creat Ratio: 3 mg/g creat (ref 0–29)
Microalbumin, Urine: 4.3 ug/mL

## 2021-06-14 LAB — HGB A1C W/O EAG: Hgb A1c MFr Bld: 5.6 % (ref 4.8–5.6)

## 2021-06-16 ENCOUNTER — Other Ambulatory Visit: Payer: Self-pay

## 2021-06-16 ENCOUNTER — Ambulatory Visit: Payer: Self-pay | Admitting: Physician Assistant

## 2021-06-16 ENCOUNTER — Encounter: Payer: Self-pay | Admitting: Physician Assistant

## 2021-06-16 VITALS — BP 126/96 | Temp 98.2°F | Resp 14 | Ht 75.0 in | Wt 235.0 lb

## 2021-06-16 DIAGNOSIS — Z0000 Encounter for general adult medical examination without abnormal findings: Secondary | ICD-10-CM

## 2021-06-16 NOTE — Progress Notes (Signed)
Pt denies any issues or concerns at this time. °

## 2021-06-16 NOTE — Progress Notes (Signed)
   Subjective: Annual physical exam    Patient ID: Christopher Watts, male    DOB: 07-Feb-1971, 50 y.o.   MRN: [id number]  HPI    Review of Systems Diabetes and glaucoma.    Objective:   Physical Exam No acute distress.  Temperature is 98.2 pulse 70, respiration 14, BP is 127/96, and patient 90% O2 sat on room air.  Patient weighs 225 pounds and BMI is 29.37. HEENT is unremarkable.  Neck is supple for lymphadenopathy or bruits.  Lungs are clear to auscultation.  Heart regular rate and rhythm.  No acute findings on EKG. Abdomen with negative HSM, normoactive bowel sounds, soft, nontender to palpation. No obvious deformity to the upper or lower extremities.  Patient has full and equal range of motion of the upper and lower extremities.  Diabetic foot exam was unremarkable.  Cranial nerves II through XII are grossly intact.  DTR 2+ without clonus.     Assessment & Plan: Well exam.   Discussed lab results with patient.  Advised continue metformin 500 mg daily.  Follow-up hemoglobin A1c in 6 months.

## 2021-11-24 ENCOUNTER — Encounter: Payer: Self-pay | Admitting: Physician Assistant

## 2021-11-24 ENCOUNTER — Ambulatory Visit: Payer: Self-pay | Admitting: Physician Assistant

## 2021-11-24 VITALS — BP 135/83 | HR 69 | Temp 97.6°F | Resp 14 | Ht 75.0 in | Wt 235.0 lb

## 2021-11-24 DIAGNOSIS — W57XXXA Bitten or stung by nonvenomous insect and other nonvenomous arthropods, initial encounter: Secondary | ICD-10-CM

## 2021-11-24 DIAGNOSIS — L089 Local infection of the skin and subcutaneous tissue, unspecified: Secondary | ICD-10-CM

## 2021-11-24 NOTE — Progress Notes (Signed)
Pt has tick bite on waist line that was dead when found Nov 25, 2022 then found another the next day 5-11 one on right shoulder blade that was embedded but did remove it. Over the weekend pt started to have more aches then usual. Ticks could possibly been on since the first week of may. ?

## 2021-11-24 NOTE — Progress Notes (Signed)
? ?  Subjective: Tick bite  ? ? Patient ID: Christopher Watts, male    DOB: 03/08/1971, 51 y.o.   MRN: [id number] ? ?HPI ?Patient presents for evaluation to tick bites which occurred on 10 Nov 2021.  Patient stated tick on the abdomen was removed proxy 1 day after being observed.  Patient stated he was not aware of the tick bite on his right scapular area until his wife which was attention about 5 days after he believes he was exposed.  Patient said he has noticed increased joint pain which he believes is different from his arthritis that he experienced in the knees and shoulders.  Denies any fever associated complaint. ?Review of Systems ?Negative except for complaint ?   ?Objective:  ? Physical Exam ? ?BP is 135/83, pulse 69, respiration 14, temperature 97.6, patient 95% O2 sat on room air.  Patient weighs 235 pounds and BMI is 29.37. ?Examination of the patient's abdomen shows area of erythema with a papular lesion at the anterior waistline.  Examination of the lesion on the patient right scapular area shows moderate erythema with a palpable lesion. ? ? ?   ?Assessment & Plan: Tick bite  ?Patient is will have labs performed for Lyme disease or [facility] spotted fever.  Patient will start doxycycline prophylactically pending lab results. ? ?

## 2021-11-26 LAB — LYME DISEASE SEROLOGY W/REFLEX: Lyme Total Antibody EIA: NEGATIVE

## 2021-11-26 LAB — ROCKY MTN SPOTTED FVR AB, IGM-BLOOD: RMSF IgM: 0.41 index (ref 0.00–0.89)

## 2021-12-03 ENCOUNTER — Other Ambulatory Visit: Payer: Self-pay

## 2021-12-03 DIAGNOSIS — E119 Type 2 diabetes mellitus without complications: Secondary | ICD-10-CM

## 2021-12-04 MED ORDER — METFORMIN HCL ER 500 MG PO TB24: 500.0000 mg | ORAL_TABLET | Freq: Every day | ORAL | 3 refills | Status: DC

## 2022-04-22 ENCOUNTER — Encounter: Payer: Self-pay | Admitting: Physician Assistant

## 2022-04-22 ENCOUNTER — Ambulatory Visit: Payer: Self-pay | Admitting: Physician Assistant

## 2022-04-22 DIAGNOSIS — S39012A Strain of muscle, fascia and tendon of lower back, initial encounter: Secondary | ICD-10-CM

## 2022-04-22 DIAGNOSIS — S161XXA Strain of muscle, fascia and tendon at neck level, initial encounter: Secondary | ICD-10-CM

## 2022-04-22 MED ORDER — ORPHENADRINE CITRATE ER 100 MG PO TB12: 100.0000 mg | ORAL_TABLET | Freq: Two times a day (BID) | ORAL | 0 refills | Status: DC

## 2022-04-22 MED ORDER — NAPROXEN 500 MG PO TABS: 500.0000 mg | ORAL_TABLET | Freq: Two times a day (BID) | ORAL | Status: DC
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
## 2022-08-19 ENCOUNTER — Other Ambulatory Visit: Payer: Self-pay

## 2022-08-20 ENCOUNTER — Other Ambulatory Visit: Payer: Self-pay

## 2022-08-20 DIAGNOSIS — R0981 Nasal congestion: Secondary | ICD-10-CM

## 2022-08-20 LAB — POC COVID19 BINAXNOW: SARS Coronavirus 2 Ag: NEGATIVE

## 2022-08-20 LAB — POCT INFLUENZA A/B
Influenza A, POC: NEGATIVE
Influenza B, POC: NEGATIVE

## 2022-08-20 NOTE — Progress Notes (Signed)
S/Sx started Tuesday (08/18/2022): Nasal congestion - clear Body aches Scratchy throat - like with allergies Temp 101 - Wednesday morning - no fever today Headache - Tuesday & Wednesday Denies cough  No flu vaccine or covid vaccine this year  Daughter tested positive for covid on Monday (home test) He & his wife have tested negative with home tests  Has only taken some tylenol for headache  AMD

## 2022-08-20 NOTE — Progress Notes (Signed)
Pt and provider updated of negative testing for covid and flu.

## 2022-10-06 ENCOUNTER — Encounter: Payer: Self-pay | Admitting: Physician Assistant

## 2022-10-06 ENCOUNTER — Ambulatory Visit: Payer: Self-pay | Admitting: Physician Assistant

## 2022-10-06 VITALS — BP 130/86 | HR 66 | Temp 97.8°F | Resp 12 | Ht 75.0 in | Wt 256.0 lb

## 2022-10-06 DIAGNOSIS — M25512 Pain in left shoulder: Secondary | ICD-10-CM

## 2022-10-06 MED ORDER — DICLOFENAC SODIUM 75 MG PO TBEC: 75.0000 mg | DELAYED_RELEASE_TABLET | Freq: Two times a day (BID) | ORAL | 0 refills | Status: DC
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
## 2022-10-06 NOTE — Progress Notes (Signed)
S: Patient presents for left shoulder pain that radiates to left elbow. States it feels similar to right tricep tendonitis injury. On exam, pain exacerbated by overhead movements & lifting objects.   Patient reports no recent trauma or injury to the area. Pain described as dull & achy. No numbness or tingling reported.   O: On exam, tenderness evident over left deltoid insertion area. No erythema or deformity noted.   A:  Left deltoid tendonitis  P:  Plan to order volteran cream to apply over affected area.

## 2022-10-06 NOTE — Progress Notes (Signed)
   Subjective:    Patient ID: Christopher Watts, male    DOB: [birth year], 52 y.o.   MRN: [id number]  HPI S: Patient presents for left shoulder pain that radiates to left elbow. States it feels similar to right tricep tendonitis injury. On exam, pain exacerbated by overhead movements & lifting objects.   Patient reports no recent trauma or injury to the area. Pain described as dull & achy. No numbness or tingling reported.    Review of Systems Musculoskeletal    Objective:   Physical Exam  On exam, tenderness evident over left deltoid insertion area. No erythema or deformity noted.       Assessment & Plan: Left deltoid tendonitis  Patient advised to avoid movements that aggravate pain. Given heat pads to apply to site as well as topical Voltaren cream to apply. Will f/u 1 week.

## 2022-10-23 ENCOUNTER — Telehealth: Payer: Self-pay

## 2022-10-23 NOTE — Telephone Encounter (Signed)
ewrror

## 2022-12-21 ENCOUNTER — Other Ambulatory Visit: Payer: Self-pay

## 2022-12-21 DIAGNOSIS — E119 Type 2 diabetes mellitus without complications: Secondary | ICD-10-CM

## 2022-12-21 MED ORDER — METFORMIN HCL ER 500 MG PO TB24: 500.0000 mg | ORAL_TABLET | Freq: Every day | ORAL | 0 refills | Status: DC

## 2023-01-04 ENCOUNTER — Ambulatory Visit: Payer: Self-pay

## 2023-01-04 DIAGNOSIS — Z0000 Encounter for general adult medical examination without abnormal findings: Secondary | ICD-10-CM

## 2023-01-05 LAB — CMP12+LP+TP+TSH+6AC+PSA+CBC…
ALT: 13 IU/L (ref 0–44)
AST: 16 IU/L (ref 0–40)
Albumin: 4.6 g/dL (ref 3.8–4.9)
Alkaline Phosphatase: 81 IU/L (ref 44–121)
BUN/Creatinine Ratio: 12 (ref 9–20)
BUN: 14 mg/dL (ref 6–24)
Basophils Absolute: 0.1 10*3/uL (ref 0.0–0.2)
Basos: 1 %
Bilirubin Total: 0.5 mg/dL (ref 0.0–1.2)
Calcium: 9 mg/dL (ref 8.7–10.2)
Chloride: 105 mmol/L (ref 96–106)
Chol/HDL Ratio: 4.9 ratio (ref 0.0–5.0)
Cholesterol, Total: 172 mg/dL (ref 100–199)
Creatinine, Ser: 1.13 mg/dL (ref 0.76–1.27)
EOS (ABSOLUTE): 0.1 10*3/uL (ref 0.0–0.4)
Eos: 2 %
Estimated CHD Risk: 1 times avg. (ref 0.0–1.0)
Free Thyroxine Index: 1.9 (ref 1.2–4.9)
GGT: 13 IU/L (ref 0–65)
Globulin, Total: 2.5 g/dL (ref 1.5–4.5)
Glucose: 80 mg/dL (ref 70–99)
HDL: 35 mg/dL — ABNORMAL LOW (ref >39)
Hematocrit: 44.8 % (ref 37.5–51.0)
Hemoglobin: 15.3 g/dL (ref 13.0–17.7)
Immature Grans (Abs): 0 10*3/uL (ref 0.0–0.1)
Immature Granulocytes: 0 %
Iron: 86 ug/dL (ref 38–169)
LDH: 168 IU/L (ref 121–224)
LDL Chol Calc (NIH): 124 mg/dL — ABNORMAL HIGH (ref 0–99)
Lymphocytes Absolute: 1.6 10*3/uL (ref 0.7–3.1)
Lymphs: 26 %
MCH: 28.9 pg (ref 26.6–33.0)
MCHC: 34.2 g/dL (ref 31.5–35.7)
MCV: 85 fL (ref 79–97)
Monocytes Absolute: 0.6 10*3/uL (ref 0.1–0.9)
Monocytes: 9 %
Neutrophils Absolute: 3.8 10*3/uL (ref 1.4–7.0)
Neutrophils: 62 %
Phosphorus: 2.8 mg/dL (ref 2.8–4.1)
Platelets: 230 10*3/uL (ref 150–450)
Potassium: 3.9 mmol/L (ref 3.5–5.2)
Prostate Specific Ag, Serum: 1.6 ng/mL (ref 0.0–4.0)
RBC: 5.3 x10E6/uL (ref 4.14–5.80)
RDW: 12.5 % (ref 11.6–15.4)
Sodium: 140 mmol/L (ref 134–144)
T3 Uptake Ratio: 28 % (ref 24–39)
T4, Total: 6.7 ug/dL (ref 4.5–12.0)
TSH: 1.95 u[IU]/mL (ref 0.450–4.500)
Total Protein: 7.1 g/dL (ref 6.0–8.5)
Triglycerides: 70 mg/dL (ref 0–149)
Uric Acid: 7.1 mg/dL (ref 3.8–8.4)
VLDL Cholesterol Cal: 13 mg/dL (ref 5–40)
WBC: 6.2 10*3/uL (ref 3.4–10.8)
eGFR: 79 mL/min/{1.73_m2} (ref >59)

## 2023-01-07 ENCOUNTER — Encounter: Payer: Self-pay | Admitting: Adult Health

## 2023-01-07 ENCOUNTER — Ambulatory Visit: Payer: Self-pay | Admitting: Adult Health

## 2023-01-07 VITALS — BP 140/84 | HR 71 | Temp 98.2°F | Resp 16 | Ht 75.0 in | Wt 240.0 lb

## 2023-01-07 DIAGNOSIS — E1140 Type 2 diabetes mellitus with diabetic neuropathy, unspecified: Secondary | ICD-10-CM

## 2023-01-07 DIAGNOSIS — Z0000 Encounter for general adult medical examination without abnormal findings: Secondary | ICD-10-CM

## 2023-01-07 NOTE — Progress Notes (Signed)
Pt presents today to complete physical, Pt states no concerns or issues.

## 2023-01-07 NOTE — Progress Notes (Signed)
[facility] [address] [place], [place] [address]  Internal MEDICINE  Office Visit Note  Patient Name: Christopher Watts  [id number]  [id number]  Date of Service: 01/07/2023  Chief Complaint  Patient presents with   Annual Exam     HPI Pt is here for routine health maintenance examination.  Patient is a well-appearing 52 year old male.  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  He just celebrated his 25th wedding anniversary with his wife.  They have a 8 year old son 73 year old daughter and a 72 year old daughter.  He has medical history significant for glaucoma and diabetes.  He is currently on metformin 500 mg slow release daily for his diabetes.  In the past 4 years he has lost well over 60 pounds and has been maintaining well.  His blood sugars are well-controlled his fasting glucose was 80 mg/dL and his G9F in clinic today was 5.1.  Patient has expressed interest in stopping the metformin at this time.  Current Medication: Outpatient Encounter Medications as of 01/07/2023  Medication Sig   metFORMIN (GLUCOPHAGE-XR) 500 MG 24 hr tablet Take 1 tablet (500 mg total) by mouth daily after breakfast.   VYZULTA 0.024 % SOLN Apply to eye.   [DISCONTINUED] diclofenac (VOLTAREN) 75 MG EC tablet Take 1 tablet (75 mg total) by mouth 2 (two) times daily.   [DISCONTINUED] orphenadrine (NORFLEX) 100 MG tablet Take 1 tablet (100 mg total) by mouth 2 (two) times daily. (Patient not taking: Reported on 10/06/2022)   No facility-administered encounter medications on file as of 01/07/2023.    Surgical History: Past Surgical History:  Procedure Laterality Date   TESTICLAR CYST EXCISION  1989    Medical History: Past Medical History:  Diagnosis Date   Arthritis     Family History: Family History  Adopted: Yes    Social History: Social History   Socioeconomic History   Marital status: Married    Spouse name: Not on file    Number of children: Not on file   Years of education: Not on file   Highest education level: Not on file  Occupational History   Not on file  Tobacco Use   Smoking status: Never   Smokeless tobacco: Never  Substance and Sexual Activity   Alcohol use: Not Currently   Drug use: Not Currently   Sexual activity: Not on file  Other Topics Concern   Not on file  Social History Narrative   Not on file   Social Determinants of Health   Financial Resource Strain: Not on file  Food Insecurity: Not on file  Transportation Needs: Not on file  Physical Activity: Not on file  Stress: Not on file  Social Connections: Not on file      Review of Systems  Constitutional:  Negative for activity change, appetite change and fatigue.  HENT:  Negative for congestion, sinus pain, trouble swallowing and voice change.   Eyes:  Negative for pain, discharge and visual disturbance.  Respiratory:  Negative for cough, chest tightness and shortness of breath.   Cardiovascular:  Negative for chest pain and leg swelling.  Gastrointestinal:  Negative for abdominal distention, abdominal pain, constipation and diarrhea.  Musculoskeletal:  Negative for arthralgias, back pain and neck pain.  Skin:  Negative for color change.  Neurological:  Negative for dizziness, weakness and headaches.  Hematological:  Negative for adenopathy.  Psychiatric/Behavioral:  Negative for agitation, confusion and suicidal ideas.  Vital Signs: BP (!) 140/84   Pulse 71   Temp 98.2 F (36.8 C) (Temporal)   Resp 16   Ht 6\' 3"  (1.905 m)   Wt 240 lb (108.9 kg)   SpO2 95%   BMI 30.00 kg/m    Physical Exam Constitutional:      Appearance: Normal appearance.  HENT:     Head: Normocephalic.     Right Ear: Tympanic membrane normal.     Left Ear: Tympanic membrane normal.     Nose: Nose normal.     Mouth/Throat:     Mouth: Mucous membranes are moist.     Pharynx: No oropharyngeal exudate or posterior oropharyngeal  erythema.  Eyes:     General:        Right eye: No discharge.        Left eye: No discharge.     Extraocular Movements: Extraocular movements intact.     Pupils: Pupils are equal, round, and reactive to light.  Cardiovascular:     Rate and Rhythm: Normal rate and regular rhythm.     Pulses: Normal pulses.     Heart sounds: Normal heart sounds. No murmur heard. Pulmonary:     Effort: Pulmonary effort is normal. No respiratory distress.     Breath sounds: Normal breath sounds. No wheezing or rhonchi.  Abdominal:     General: Abdomen is flat. Bowel sounds are normal. There is no distension.     Palpations: There is no mass.     Tenderness: There is no abdominal tenderness. There is no guarding.     Hernia: No hernia is present.  Musculoskeletal:        General: No swelling or deformity. Normal range of motion.     Cervical back: Normal range of motion.  Skin:    General: Skin is warm and dry.     Capillary Refill: Capillary refill takes less than 2 seconds.  Neurological:     General: No focal deficit present.     Mental Status: He is alert.     Cranial Nerves: No cranial nerve deficit.     Gait: Gait normal.  Psychiatric:        Mood and Affect: Mood normal.        Behavior: Behavior normal.        Judgment: Judgment normal.      LABS: Recent Results (from the past 2160 hour(s))  Male Executive Panel     Status: Abnormal   Collection Time: 01/04/23  8:48 AM  Result Value Ref Range   Glucose 80 70 - 99 mg/dL   Uric Acid 7.1 3.8 - 8.4 mg/dL    Comment:            Therapeutic target for gout patients: <6.0   BUN 14 6 - 24 mg/dL   Creatinine, Ser 9.52 0.76 - 1.27 mg/dL   eGFR 79 >84 XL/KGM/0.10   BUN/Creatinine Ratio 12 9 - 20   Sodium 140 134 - 144 mmol/L   Potassium 3.9 3.5 - 5.2 mmol/L   Chloride 105 96 - 106 mmol/L   Calcium 9.0 8.7 - 10.2 mg/dL   Phosphorus 2.8 2.8 - 4.1 mg/dL   Total Protein 7.1 6.0 - 8.5 g/dL   Albumin 4.6 3.8 - 4.9 g/dL   Globulin, Total 2.5  1.5 - 4.5 g/dL   Bilirubin Total 0.5 0.0 - 1.2 mg/dL   Alkaline Phosphatase 81 44 - 121 IU/L   LDH 168 121 - 224 IU/L  AST 16 0 - 40 IU/L   ALT 13 0 - 44 IU/L   GGT 13 0 - 65 IU/L   Iron 86 38 - 169 ug/dL   Cholesterol, Total 161 100 - 199 mg/dL   Triglycerides 70 0 - 149 mg/dL   HDL 35 (L) >09 mg/dL   VLDL Cholesterol Cal 13 5 - 40 mg/dL   LDL Chol Calc (NIH) 604 (H) 0 - 99 mg/dL   Chol/HDL Ratio 4.9 0.0 - 5.0 ratio    Comment:                                   T. Chol/HDL Ratio                                             Men  Women                               1/2 Avg.Risk  3.4    3.3                                   Avg.Risk  5.0    4.4                                2X Avg.Risk  9.6    7.1                                3X Avg.Risk 23.4   11.0    Estimated CHD Risk 1.0 0.0 - 1.0 times avg.    Comment: The CHD Risk is based on the T. Chol/HDL ratio. Other factors affect CHD Risk such as hypertension, smoking, diabetes, severe obesity, and family history of premature CHD.    TSH 1.950 0.450 - 4.500 uIU/mL   T4, Total 6.7 4.5 - 12.0 ug/dL   T3 Uptake Ratio 28 24 - 39 %   Free Thyroxine Index 1.9 1.2 - 4.9   Prostate Specific Ag, Serum 1.6 0.0 - 4.0 ng/mL    Comment: Roche ECLIA methodology. According to the American Urological Association, Serum PSA should decrease and remain at undetectable levels after radical prostatectomy. The AUA defines biochemical recurrence as an initial PSA value 0.2 ng/mL or greater followed by a subsequent confirmatory PSA value 0.2 ng/mL or greater. Values obtained with different assay methods or kits cannot be used interchangeably. Results cannot be interpreted as absolute evidence of the presence or absence of malignant disease.    WBC 6.2 3.4 - 10.8 x10E3/uL   RBC 5.30 4.14 - 5.80 x10E6/uL   Hemoglobin 15.3 13.0 - 17.7 g/dL   Hematocrit 54.0 98.1 - 51.0 %   MCV 85 79 - 97 fL   MCH 28.9 26.6 - 33.0 pg   MCHC 34.2 31.5 - 35.7 g/dL    RDW 19.1 47.8 - 29.5 %   Platelets 230 150 - 450 x10E3/uL   Neutrophils 62 Not Estab. %   Lymphs 26 Not Estab. %   Monocytes 9 Not Estab. %   Eos 2 Not Estab. %  Basos 1 Not Estab. %   Neutrophils Absolute 3.8 1.4 - 7.0 x10E3/uL   Lymphocytes Absolute 1.6 0.7 - 3.1 x10E3/uL   Monocytes Absolute 0.6 0.1 - 0.9 x10E3/uL   EOS (ABSOLUTE) 0.1 0.0 - 0.4 x10E3/uL   Basophils Absolute 0.1 0.0 - 0.2 x10E3/uL   Immature Granulocytes 0 Not Estab. %   Immature Grans (Abs) 0.0 0.0 - 0.1 x10E3/uL    Assessment/Plan: 1. Annual physical exam No concerns.  Labs reviewed. Follow up as needed.   2. Type 2 diabetes mellitus with diabetic neuropathy, without long-term current use of insulin (HCC) Discussed stopping metformin at this time.  Patient is on low-dose with a excellent A1c and fasting glucose.  Stop metformin at this time have A1c rechecked in 90 days.  Discussed importance of diet and exercise maintenance especially now that he will be off the metformin.     General Counseling: cheney ewart understanding of the findings of todays visit and agrees with plan of treatment. I have discussed any further diagnostic evaluation that may be needed or ordered today. We also reviewed his medications today. he has been encouraged to call the office with any questions or concerns that should arise related to todays visit.    No orders of the defined types were placed in this encounter.   No orders of the defined types were placed in this encounter.   Total time spent:20 Minutes  Time spent includes review of chart, medications, test results, and follow up plan with the patient.    Johnna Acosta AGNP-C Nurse Practitioner

## 2023-04-08 ENCOUNTER — Other Ambulatory Visit: Payer: Self-pay

## 2023-10-19 ENCOUNTER — Encounter: Admitting: Physician Assistant

## 2023-11-30 ENCOUNTER — Ambulatory Visit: Payer: Self-pay

## 2023-11-30 DIAGNOSIS — Z0000 Encounter for general adult medical examination without abnormal findings: Secondary | ICD-10-CM

## 2023-11-30 DIAGNOSIS — E1140 Type 2 diabetes mellitus with diabetic neuropathy, unspecified: Secondary | ICD-10-CM

## 2023-12-01 LAB — CMP12+LP+TP+TSH+6AC+PSA+CBC…
ALT: 16 IU/L (ref 0–44)
AST: 16 IU/L (ref 0–40)
Albumin: 4.5 g/dL (ref 3.8–4.9)
Alkaline Phosphatase: 83 IU/L (ref 44–121)
BUN/Creatinine Ratio: 13 (ref 9–20)
BUN: 13 mg/dL (ref 6–24)
Basophils Absolute: 0.1 10*3/uL (ref 0.0–0.2)
Basos: 1 %
Bilirubin Total: 0.4 mg/dL (ref 0.0–1.2)
Calcium: 8.8 mg/dL (ref 8.7–10.2)
Chloride: 105 mmol/L (ref 96–106)
Chol/HDL Ratio: 5.3 ratio — ABNORMAL HIGH (ref 0.0–5.0)
Cholesterol, Total: 170 mg/dL (ref 100–199)
Creatinine, Ser: 0.99 mg/dL (ref 0.76–1.27)
EOS (ABSOLUTE): 0.1 10*3/uL (ref 0.0–0.4)
Eos: 2 %
Estimated CHD Risk: 1.1 times avg. — ABNORMAL HIGH (ref 0.0–1.0)
Free Thyroxine Index: 2.1 (ref 1.2–4.9)
GGT: 13 IU/L (ref 0–65)
Globulin, Total: 2.6 g/dL (ref 1.5–4.5)
Glucose: 89 mg/dL (ref 70–99)
HDL: 32 mg/dL — ABNORMAL LOW (ref >39)
Hematocrit: 46.7 % (ref 37.5–51.0)
Hemoglobin: 15.8 g/dL (ref 13.0–17.7)
Immature Grans (Abs): 0 10*3/uL (ref 0.0–0.1)
Immature Granulocytes: 0 %
Iron: 87 ug/dL (ref 38–169)
LDH: 184 IU/L (ref 121–224)
LDL Chol Calc (NIH): 121 mg/dL — ABNORMAL HIGH (ref 0–99)
Lymphocytes Absolute: 1.4 10*3/uL (ref 0.7–3.1)
Lymphs: 22 %
MCH: 29.6 pg (ref 26.6–33.0)
MCHC: 33.8 g/dL (ref 31.5–35.7)
MCV: 88 fL (ref 79–97)
Monocytes Absolute: 0.5 10*3/uL (ref 0.1–0.9)
Monocytes: 9 %
Neutrophils Absolute: 4.1 10*3/uL (ref 1.4–7.0)
Neutrophils: 66 %
Phosphorus: 2.6 mg/dL — ABNORMAL LOW (ref 2.8–4.1)
Platelets: 203 10*3/uL (ref 150–450)
Potassium: 4.1 mmol/L (ref 3.5–5.2)
Prostate Specific Ag, Serum: 1.4 ng/mL (ref 0.0–4.0)
RBC: 5.33 x10E6/uL (ref 4.14–5.80)
RDW: 12.8 % (ref 11.6–15.4)
Sodium: 141 mmol/L (ref 134–144)
T3 Uptake Ratio: 27 % (ref 24–39)
T4, Total: 7.7 ug/dL (ref 4.5–12.0)
TSH: 2.07 u[IU]/mL (ref 0.450–4.500)
Total Protein: 7.1 g/dL (ref 6.0–8.5)
Triglycerides: 88 mg/dL (ref 0–149)
Uric Acid: 6.7 mg/dL (ref 3.8–8.4)
VLDL Cholesterol Cal: 17 mg/dL (ref 5–40)
WBC: 6.1 10*3/uL (ref 3.4–10.8)
eGFR: 92 mL/min/{1.73_m2} (ref >59)

## 2023-12-01 LAB — HGB A1C W/O EAG: Hgb A1c MFr Bld: 5.7 % — ABNORMAL HIGH (ref 4.8–5.6)

## 2023-12-07 ENCOUNTER — Encounter: Admitting: Physician Assistant

## 2023-12-09 ENCOUNTER — Ambulatory Visit: Payer: Self-pay | Admitting: Physician Assistant

## 2023-12-09 ENCOUNTER — Encounter: Payer: Self-pay | Admitting: Physician Assistant

## 2023-12-09 VITALS — BP 120/80 | HR 73 | Temp 98.0°F | Resp 14 | Ht 75.0 in | Wt 240.0 lb

## 2023-12-09 DIAGNOSIS — Z0000 Encounter for general adult medical examination without abnormal findings: Secondary | ICD-10-CM

## 2023-12-09 LAB — POCT URINALYSIS DIPSTICK
Bilirubin, UA: NEGATIVE
Blood, UA: NEGATIVE
Glucose, UA: NEGATIVE
Ketones, UA: NEGATIVE
Leukocytes, UA: NEGATIVE
Nitrite, UA: NEGATIVE
Protein, UA: NEGATIVE
Spec Grav, UA: 1.02 (ref 1.010–1.025)
Urobilinogen, UA: 0.2 U/dL
pH, UA: 6 (ref 5.0–8.0)

## 2023-12-09 NOTE — Progress Notes (Signed)
 [facility] ____________________________________________   None    (approximate)  I have reviewed the triage vital signs and the nursing notes.   HISTORY  Chief Complaint No chief complaint on file.   HPI Christopher Watts is a 53 y.o. male patient presents for annual physical exam.  Voices no complaints or concerns.         Past Medical History:  Diagnosis Date   Arthritis     Patient Active Problem List   Diagnosis Date Noted   Obesity, unspecified 12/08/2012    Past Surgical History:  Procedure Laterality Date   TESTICLAR CYST EXCISION  1989    Prior to Admission medications   Medication Sig Start Date End Date Taking? Authorizing Provider  metFORMIN  (GLUCOPHAGE -XR) 500 MG 24 hr tablet Take 1 tablet (500 mg total) by mouth daily after breakfast. 12/21/22   [name], PA-C  VYZULTA 0.024 % SOLN Apply to eye. 06/12/21   [provider]    Allergies Penicillins  Family History  Adopted: Yes    Social History Social History   Tobacco Use   Smoking status: Never   Smokeless tobacco: Never  Substance Use Topics   Alcohol use: Not Currently   Drug use: Not Currently    Review of Systems Constitutional: No fever/chills Eyes: No visual changes. ENT: No sore throat. Cardiovascular: Denies chest pain. Respiratory: Denies shortness of breath. Gastrointestinal: No abdominal pain.  No nausea, no vomiting.  No diarrhea.  No constipation. Genitourinary: Negative for dysuria. Musculoskeletal: Negative for back pain.  Arthritis Skin: Negative for rash. Neurological: Negative for headaches, focal weakness or numbness. Allergic/Immunilogical: Penicillin  ____________________________________________   PHYSICAL EXAM:  VITAL SIGNS: BP 124/91BP. 124/91. Data is abnormal. Taken on 12/09/23 10:55 AM  Cuff Size Large  Pulse Rate 73  Temp 98 F (36.7 C)  Temp Source Temporal  Weight 240 lb (108.9 kg)   Height 6\' 3"  (1.905 m)  Resp 14  SpO2 98 %   BMI: 30.00 kg/m2  BSA: 2.40 m2   Constitutional: Alert and oriented. Well appearing and in no acute distress. Eyes: Conjunctivae are normal. PERRL. EOMI. Head: Atraumatic. Nose: No congestion/rhinnorhea. Mouth/Throat: Mucous membranes are moist.  Oropharynx non-erythematous. Neck: No stridor. No cervical spine tenderness to palpation. Hematological/Lymphatic/Immunilogical: No cervical lymphadenopathy. Cardiovascular: Normal rate, regular rhythm. Grossly normal heart sounds.  Good peripheral circulation. Respiratory: Normal respiratory effort.  No retractions. Lungs CTAB. Gastrointestinal: Soft and nontender. No distention. No abdominal bruits. No CVA tenderness. Genitourinary: Deferred Musculoskeletal: No lower extremity tenderness nor edema.  No joint effusions. Neurologic:  Normal speech and language. No gross focal neurologic deficits are appreciated. No gait instability. Skin:  Skin is warm, dry and intact. No rash noted. Psychiatric: Mood and affect are normal. Speech and behavior are normal.  ____________________________________________   LABS          Component Ref Range & Units (hover) 9 d ago 11 mo ago 2 yr ago 3 yr ago  Glucose 89 80 91 88 R  Uric Acid 6.7 7.1 CM 5.4 CM 5.2 CM  Comment:            Therapeutic target for gout patients: <6.0  BUN 13 14 12 12   Creatinine, Ser 0.99 1.13 1.02 0.98  eGFR 92 79 90 95  BUN/Creatinine Ratio 13 12 12 12   Sodium 141 140 142 145 High   Potassium 4.1 3.9 4.3 4.4  Chloride 105 105 104 104  Calcium 8.8 9.0 9.2  8.9  Phosphorus 2.6 Low  2.8 3.0 2.9  Total Protein 7.1 7.1 7.2 6.7  Albumin 4.5 4.6 4.9 R 4.3 R  Globulin, Total 2.6 2.5 2.3 2.4  Bilirubin Total 0.4 0.5 0.5 0.5  Alkaline Phosphatase 83 81 72 87  LDH 184 168 146 130  AST 16 16 14 14   ALT 16 13 17 26   GGT 13 13 10 16   Iron 87 86 108 74  Cholesterol, Total 170 172 166 166  Triglycerides 88 70 65 62  HDL 32 Low  35  Low  37 Low  30 Low   VLDL Cholesterol Cal 17 13 13 12   LDL Chol Calc (NIH) 121 High  124 High  116 High  124 High   Chol/HDL Ratio 5.3 High  4.9 CM 4.5 CM 5.5 High  CM  Comment:                                   T. Chol/HDL Ratio                                             Men  Women                               1/2 Avg.Risk  3.4    3.3                                   Avg.Risk  5.0    4.4                                2X Avg.Risk  9.6    7.1                                3X Avg.Risk 23.4   11.0  Estimated CHD Risk 1.1 High  1.0 CM 0.9 CM 1.2 High  CM  Comment: The CHD Risk is based on the T. Chol/HDL ratio. Other factors affect CHD Risk such as hypertension, smoking, diabetes, severe obesity, and family history of premature CHD.  TSH 2.070 1.950 1.610 1.650  T4, Total 7.7 6.7 6.5 7.5  T3 Uptake Ratio 27 28 26 27   Free Thyroxine Index 2.1 1.9 1.7 2.0  Prostate Specific Ag, Serum 1.4 1.6 CM 2.3 CM 2.6 CM  Comment: Roche ECLIA methodology. According to the American U[employer] Association, Serum PSA should decrease and remain at undetectable levels after radical prostatectomy. The AUA defines biochemical recurrence as an initial PSA value 0.2 ng/mL or greater followed by a subsequent confirmatory PSA value 0.2 ng/mL or greater. Values obtained with different assay methods or kits cannot be used interchangeably. Results cannot be interpreted as absolute evidence of the presence or absence of malignant disease.  WBC 6.1 6.2 6.0 7.3  RBC 5.33 5.30 5.37 4.97  Hemoglobin 15.8 15.3 15.2 14.2  Hematocrit 46.7 44.8 45.5 41.8  MCV 88 85 85 84  MCH 29.6 28.9 28.3 28.6  MCHC 33.8 34.2 33.4 34.0  RDW 12.8 12.5 12.2 12.8  Platelets 203 230 218 231  Neutrophils  66 62 62 69  Lymphs 22 26 27 19   Monocytes 9 9 8 9   Eos 2 2 2 2   Basos 1 1 1 1   Neutrophils Absolute 4.1 3.8 3.7 5.0  Lymphocytes Absolute 1.4 1.6 1.7 1.4  Monocytes Absolute 0.5 0.6 0.5 0.7  EOS (ABSOLUTE) 0.1 0.1 0.1 0.1   Basophils Absolute 0.1 0.1 0.1 0.1  Immature Granulocytes 0 0 0 0  Immature Grans (Abs) 0.0 0.0 0.0 0.0                  Component Ref Range & Units (hover) 9 d ago (11/30/23) 2 yr ago (06/13/21) 3 yr ago (11/01/20) 3 yr ago (11/01/20)  Hgb A1c MFr Bld 5.7 High  5.6 CM CANCELED R, CM 5.2 CM  Comment:          Prediabetes: 5.7 - 6.4          Diabetes: >6.4          Glycemic control for adults with diabetes: <7.0          ____________________________________________  EKG  Sinus rhythm at 74 bpm ____________________________________________    ____________________________________________   INITIAL IMPRESSION / ASSESSMENT AND PLAN / ED COURSE  As part of my medical decision making, I reviewed the following data within the electronic MEDICAL RECORD NUMBER      No acute findings on physical exam, EKG, labs.        ____________________________________________   FINAL CLINICAL IMPRESSION Well exam   ED Discharge Orders     None        Note:  This document was prepared using Dragon voice recognition software and may include unintentional dictation errors.

## 2023-12-09 NOTE — Progress Notes (Signed)
Pt presents today to complete physical, pt denies any issues or concerns at this time/CL,RMA  

## 2024-07-21 ENCOUNTER — Other Ambulatory Visit: Payer: Self-pay

## 2024-07-21 DIAGNOSIS — H47012 Ischemic optic neuropathy, left eye: Secondary | ICD-10-CM

## 2024-07-21 DIAGNOSIS — H469 Unspecified optic neuritis: Secondary | ICD-10-CM

## 2024-08-03 ENCOUNTER — Ambulatory Visit
Admission: RE | Admit: 2024-08-03 | Discharge: 2024-08-03 | Disposition: A | Discharge status: Home / Self Care | Source: Ambulatory Visit

## 2024-08-03 DIAGNOSIS — H469 Unspecified optic neuritis: Secondary | ICD-10-CM

## 2024-08-03 MED ORDER — GADOPICLENOL 0.5 MMOL/ML IV SOLN
10.0000 mL | Freq: Once | INTRAVENOUS | Status: AC | PRN
  Administered 2024-08-03: 10 mL via INTRAVENOUS
